# Patient Record
Sex: Female | Born: 1990 | Race: White | Hispanic: No | Marital: Married | Smoking: Former smoker
Health system: Southern US, Community
[De-identification: ages and names within clinical notes are randomized; demographics above are authoritative.]

## PROBLEM LIST (undated history)

## (undated) DIAGNOSIS — F329 Major depressive disorder, single episode, unspecified: Secondary | ICD-10-CM

## (undated) DIAGNOSIS — D509 Iron deficiency anemia, unspecified: Secondary | ICD-10-CM

## (undated) DIAGNOSIS — F32A Depression, unspecified: Secondary | ICD-10-CM

## (undated) DIAGNOSIS — G8929 Other chronic pain: Secondary | ICD-10-CM

## (undated) DIAGNOSIS — R519 Headache, unspecified: Secondary | ICD-10-CM

## (undated) DIAGNOSIS — F419 Anxiety disorder, unspecified: Secondary | ICD-10-CM

## (undated) HISTORY — DX: Depression, unspecified: F32.A

## (undated) HISTORY — DX: Anxiety disorder, unspecified: F41.9

## (undated) HISTORY — PX: TONSILLECTOMY: SUR1361

## (undated) HISTORY — DX: Iron deficiency anemia, unspecified: D50.9

## (undated) HISTORY — DX: Headache, unspecified: R51.9

## (undated) HISTORY — DX: Other chronic pain: G89.29

---
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
## 2013-05-16 ENCOUNTER — Encounter (HOSPITAL_COMMUNITY): Payer: Self-pay | Admitting: Emergency Medicine

## 2013-05-16 ENCOUNTER — Emergency Department (HOSPITAL_COMMUNITY)
Admission: EM | Admit: 2013-05-16 | Discharge: 2013-05-16 | Disposition: A | Discharge status: Home / Self Care | Payer: Self-pay | Attending: Emergency Medicine | Admitting: Emergency Medicine

## 2013-05-16 DIAGNOSIS — S61412A Laceration without foreign body of left hand, initial encounter: Secondary | ICD-10-CM

## 2013-05-16 DIAGNOSIS — S61409A Unspecified open wound of unspecified hand, initial encounter: Secondary | ICD-10-CM | POA: Insufficient documentation

## 2013-05-16 DIAGNOSIS — Y929 Unspecified place or not applicable: Secondary | ICD-10-CM | POA: Insufficient documentation

## 2013-05-16 DIAGNOSIS — Y93G9 Activity, other involving cooking and grilling: Secondary | ICD-10-CM | POA: Insufficient documentation

## 2013-05-16 DIAGNOSIS — W269XXA Contact with unspecified sharp object(s), initial encounter: Secondary | ICD-10-CM | POA: Insufficient documentation

## 2013-05-16 MED ORDER — HYDROCODONE-ACETAMINOPHEN 5-325 MG PO TABS
1.0000 | ORAL_TABLET | Freq: Once | ORAL | Status: AC
  Administered 2013-05-16: 1 via ORAL
  Filled 2013-05-16: qty 1

## 2013-05-16 MED ORDER — LIDOCAINE-EPINEPHRINE 2 %-1:100000 IJ SOLN
20.0000 mL | Freq: Once | INTRAMUSCULAR | Status: DC
  Filled 2013-05-16: qty 20

## 2013-05-16 MED ORDER — DIPHENHYDRAMINE HCL 25 MG PO CAPS
25.0000 mg | ORAL_CAPSULE | Freq: Once | ORAL | Status: AC
  Administered 2013-05-16: 25 mg via ORAL
  Filled 2013-05-16: qty 1

## 2013-05-16 NOTE — Discharge Instructions (Signed)
Please have your sutures remove in 7 days.  Return sooner if you notice signs of infection  Laceration Care, Adult A laceration is a cut or lesion that goes through all layers of the skin and into the tissue just beneath the skin. TREATMENT  Some lacerations may not require closure. Some lacerations may not be able to be closed due to an increased risk of infection. It is important to see your caregiver as soon as possible after an injury to minimize the risk of infection and maximize the opportunity for successful closure. If closure is appropriate, pain medicines may be given, if needed. The wound will be cleaned to help prevent infection. Your caregiver will use stitches (sutures), staples, wound glue (adhesive), or skin adhesive strips to repair the laceration. These tools bring the skin edges together to allow for faster healing and a better cosmetic outcome. However, all wounds will heal with a scar. Once the wound has healed, scarring can be minimized by covering the wound with sunscreen during the day for 1 full year. HOME CARE INSTRUCTIONS  For sutures or staples:  Keep the wound clean and dry.  If you were given a bandage (dressing), you should change it at least once a day. Also, change the dressing if it becomes wet or dirty, or as directed by your caregiver.  Wash the wound with soap and water 2 times a day. Rinse the wound off with water to remove all soap. Pat the wound dry with a clean towel.  After cleaning, apply a thin layer of the antibiotic ointment as recommended by your caregiver. This will help prevent infection and keep the dressing from sticking.  You may shower as usual after the first 24 hours. Do not soak the wound in water until the sutures are removed.  Only take over-the-counter or prescription medicines for pain, discomfort, or fever as directed by your caregiver.  Get your sutures or staples removed as directed by your caregiver. For skin adhesive  strips:  Keep the wound clean and dry.  Do not get the skin adhesive strips wet. You may bathe carefully, using caution to keep the wound dry.  If the wound gets wet, pat it dry with a clean towel.  Skin adhesive strips will fall off on their own. You may trim the strips as the wound heals. Do not remove skin adhesive strips that are still stuck to the wound. They will fall off in time. For wound adhesive:  You may briefly wet your wound in the shower or bath. Do not soak or scrub the wound. Do not swim. Avoid periods of heavy perspiration until the skin adhesive has fallen off on its own. After showering or bathing, gently pat the wound dry with a clean towel.  Do not apply liquid medicine, cream medicine, or ointment medicine to your wound while the skin adhesive is in place. This may loosen the film before your wound is healed.  If a dressing is placed over the wound, be careful not to apply tape directly over the skin adhesive. This may cause the adhesive to be pulled off before the wound is healed.  Avoid prolonged exposure to sunlight or tanning lamps while the skin adhesive is in place. Exposure to ultraviolet light in the first year will darken the scar.  The skin adhesive will usually remain in place for 5 to 10 days, then naturally fall off the skin. Do not pick at the adhesive film. You may need a tetanus shot if:  You cannot remember when you had your last tetanus shot.  You have never had a tetanus shot. If you get a tetanus shot, your arm may swell, get red, and feel warm to the touch. This is common and not a problem. If you need a tetanus shot and you choose not to have one, there is a rare chance of getting tetanus. Sickness from tetanus can be serious. SEEK MEDICAL CARE IF:   You have redness, swelling, or increasing pain in the wound.  You see a red line that goes away from the wound.  You have yellowish-white fluid (pus) coming from the wound.  You have a  fever.  You notice a bad smell coming from the wound or dressing.  Your wound breaks open before or after sutures have been removed.  You notice something coming out of the wound such as wood or glass.  Your wound is on your hand or foot and you cannot move a finger or toe. SEEK IMMEDIATE MEDICAL CARE IF:   Your pain is not controlled with prescribed medicine.  You have severe swelling around the wound causing pain and numbness or a change in color in your arm, hand, leg, or foot.  Your wound splits open and starts bleeding.  You have worsening numbness, weakness, or loss of function of any joint around or beyond the wound.  You develop painful lumps near the wound or on the skin anywhere on your body. MAKE SURE YOU:   Understand these instructions.  Will watch your condition.  Will get help right away if you are not doing well or get worse. Document Released: 02/18/2005 Document Revised: 05/13/2011 Document Reviewed: 08/14/2010 [facility] Patient Information 2014 [place], [place].

## 2013-05-16 NOTE — ED Provider Notes (Signed)
CSN: [id number]     Arrival date & time 05/16/13  2142 History  This chart was scribed for non-physician practitioner, Fayrene Helper, PA-C working with Bonnita Levan. Bernette Mayers, MD by Greggory Stallion, ED scribe. This patient was seen in room TR06C/TR06C and the patient's care was started at 10:08 PM.   Chief Complaint  Patient presents with  . Laceration   The history is provided by the patient. No language interpreter was used.   HPI Comments: Selena Malone is a 23 y.o. female who presents to the Emergency Department complaining of a laceration to her left palm that occurred about 45 minutes ago. Pt was trying to move a food processor and states it sliced her hand. She has sudden onset throbbing left hand pain. Pt states her last tetanus was less than one year ago. No numbness, no other injury.  No specific treatment tried.    History reviewed. No pertinent past medical history. History reviewed. No pertinent past surgical history. No family history on file. History  Substance Use Topics  . Smoking status: Never Smoker   . Smokeless tobacco: Not on file  . Alcohol Use: No   OB History   Grav Para Term Preterm Abortions TAB SAB Ect Mult Living                 Review of Systems  Musculoskeletal: Positive for arthralgias.  Skin: Positive for wound.  All other systems reviewed and are negative.   Allergies  Codeine  Home Medications  No current outpatient prescriptions on file.  BP 119/70  Pulse 96  Temp(Src) 98.8 F (37.1 C) (Oral)  Resp 14  Ht 5\' 3"  (1.6 m)  Wt 186 lb (84.369 kg)  BMI 32.96 kg/m2  SpO2 100%  Physical Exam  Nursing note and vitals reviewed. Constitutional: She is oriented to person, place, and time. She appears well-developed and well-nourished. No distress.  HENT:  Head: Normocephalic and atraumatic.  Eyes: EOM are normal.  Neck: Neck supple. No tracheal deviation present.  Cardiovascular: Normal rate.   Pulmonary/Chest: Effort normal. No respiratory  distress.  Musculoskeletal: Normal range of motion.  Neurological: She is alert and oriented to person, place, and time.  Skin: Skin is warm and dry.  Left hand has a 2.5 cm superficial laceration noted to the hypothenar aspects of the palm. Actively bleeding. No foreign object noted.   Psychiatric: She has a normal mood and affect. Her behavior is normal.    ED Course  Procedures (including critical care time)  DIAGNOSTIC STUDIES: Oxygen Saturation is 100% on RA, normal by my interpretation.    COORDINATION OF CARE: 10:10 PM-Discussed treatment plan which includes laceration repair and pain medication with pt at bedside and pt agreed to plan.   LACERATION REPAIR Performed by: Fayrene Helper Authorized byFayrene Helper Consent: Verbal consent obtained. Risks and benefits: risks, benefits and alternatives were discussed Consent given by: patient Patient identity confirmed: provided demographic data Prepped and Draped in normal sterile fashion Wound explored  Laceration Location: L hand, palmar (hypothenar)  Laceration Length: 2.5cm  No Foreign Bodies seen or palpated  Anesthesia: local infiltration  Local anesthetic: lidocaine 2% w epinephrine  Anesthetic total: 5 ml  Irrigation method: syringe Amount of cleaning: standard  Skin closure: prolene 5.0  Number of sutures: 6  Technique: simple interrupted  Patient tolerance: Patient tolerated the procedure well with no immediate complications.    Labs Review Labs Reviewed - No data to display Imaging Review No results found.   EKG  Interpretation None      MDM   Final diagnoses:  Laceration of left hand without complication, excluding fingers    BP 119/70  Pulse 96  Temp(Src) 98.8 F (37.1 C) (Oral)  Resp 14  Ht 5\' 3"  (1.6 m)  Wt 186 lb (84.369 kg)  BMI 32.96 kg/m2  SpO2 100% I personally performed the services described in this documentation, which was scribed in my presence. The recorded information  has been reviewed and is accurate.    Fayrene HelperBowie Zaylyn Bergdoll, PA-C 05/16/13 [phone]

## 2013-05-16 NOTE — ED Notes (Signed)
Pt. presents with laceration approx . 1 inch at left medial palm sustained this evening while preparing food . Dressing applied at triage .

## 2013-05-16 NOTE — ED Provider Notes (Signed)
Medical screening examination/treatment/procedure(s) were performed by non-physician practitioner and as supervising physician I was immediately available for consultation/collaboration.   EKG Interpretation None        Anyae Griffith B. Bernette MayersSheldon, MD 05/16/13 [phone]

## 2013-08-19 ENCOUNTER — Encounter (HOSPITAL_COMMUNITY): Payer: Self-pay | Admitting: Emergency Medicine

## 2013-08-19 ENCOUNTER — Emergency Department (HOSPITAL_COMMUNITY)
Admission: EM | Admit: 2013-08-19 | Discharge: 2013-08-19 | Disposition: A | Discharge status: Home / Self Care | Payer: Self-pay | Attending: Emergency Medicine | Admitting: Emergency Medicine

## 2013-08-19 DIAGNOSIS — J029 Acute pharyngitis, unspecified: Secondary | ICD-10-CM | POA: Insufficient documentation

## 2013-08-19 DIAGNOSIS — F17200 Nicotine dependence, unspecified, uncomplicated: Secondary | ICD-10-CM | POA: Insufficient documentation

## 2013-08-19 LAB — RAPID STREP SCREEN (MED CTR MEBANE ONLY): Streptococcus, Group A Screen (Direct): NEGATIVE

## 2013-08-19 MED ORDER — IBUPROFEN 800 MG PO TABS
800.0000 mg | ORAL_TABLET | Freq: Once | ORAL | Status: AC
  Administered 2013-08-19: 800 mg via ORAL
  Filled 2013-08-19: qty 1

## 2013-08-19 MED ORDER — IBUPROFEN 600 MG PO TABS: 600.0000 mg | ORAL_TABLET | Freq: Four times a day (QID) | ORAL | Status: DC | PRN

## 2013-08-19 NOTE — Discharge Instructions (Signed)
Pharyngitis °Pharyngitis is redness, pain, and swelling (inflammation) of your pharynx.  °CAUSES  °Pharyngitis is usually caused by infection. Most of the time, these infections are from viruses (viral) and are part of a cold. However, sometimes pharyngitis is caused by bacteria (bacterial). Pharyngitis can also be caused by allergies. Viral pharyngitis may be spread from person to person by coughing, sneezing, and personal items or utensils (cups, forks, spoons, toothbrushes). Bacterial pharyngitis may be spread from person to person by more intimate contact, such as kissing.  °SIGNS AND SYMPTOMS  °Symptoms of pharyngitis include:   °· Sore throat.   °· Tiredness (fatigue).   °· Low-grade fever.   °· Headache. °· Joint pain and muscle aches. °· Skin rashes. °· Swollen lymph nodes. °· Plaque-like film on throat or tonsils (often seen with bacterial pharyngitis). °DIAGNOSIS  °Your health care provider will ask you questions about your illness and your symptoms. Your medical history, along with a physical exam, is often all that is needed to diagnose pharyngitis. Sometimes, a rapid strep test is done. Other lab tests may also be done, depending on the suspected cause.  °TREATMENT  °Viral pharyngitis will usually get better in 3-4 days without the use of medicine. Bacterial pharyngitis is treated with medicines that kill germs (antibiotics).  °HOME CARE INSTRUCTIONS  °· Drink enough water and fluids to keep your urine clear or pale yellow.   °· Only take over-the-counter or prescription medicines as directed by your health care provider:   °¨ If you are prescribed antibiotics, make sure you finish them even if you start to feel better.   °¨ Do not take aspirin.   °· Get lots of rest.   °· Gargle with 8 oz of salt water (½ tsp of salt per 1 qt of water) as often as every 1-2 hours to soothe your throat.   °· Throat lozenges (if you are not at risk for choking) or sprays may be used to soothe your throat. °SEEK MEDICAL  CARE IF:  °· You have large, tender lumps in your neck. °· You have a rash. °· You cough up green, yellow-brown, or bloody spit. °SEEK IMMEDIATE MEDICAL CARE IF:  °· Your neck becomes stiff. °· You drool or are unable to swallow liquids. °· You vomit or are unable to keep medicines or liquids down. °· You have severe pain that does not go away with the use of recommended medicines. °· You have trouble breathing (not caused by a stuffy nose). °MAKE SURE YOU:  °· Understand these instructions. °· Will watch your condition. °· Will get help right away if you are not doing well or get worse. °Document Released: 02/18/2005 Document Revised: 12/09/2012 Document Reviewed: 10/26/2012 °Exit[employer]® Patient Information ©2015 [employer]. This information is not intended to replace advice given to you by your health care provider. Make sure you discuss any questions you have with your health care provider. ° °Salt Water Gargle °This solution will help make your mouth and throat feel better. °HOME CARE INSTRUCTIONS  °· Mix 1 teaspoon of salt in 8 ounces of warm water. °· Gargle with this solution as much or often as you need or as directed. Swish and gargle gently if you have any sores or wounds in your mouth. °· Do not swallow this mixture. °Document Released: 11/23/2003 Document Revised: 05/13/2011 Document Reviewed: 04/15/2008 °ExitCare® Patient Information ©2015 [employer]. This information is not intended to replace advice given to you by your health care provider. Make sure you discuss any questions you have with your health care provider. ° °

## 2013-08-19 NOTE — ED Provider Notes (Signed)
TIME SEEN: 7:49 AM  CHIEF COMPLAINT: Sore throat  HPI: Patient is a 23 year old female with no significant past medical history who presents emergency department with sore throat that started yesterday, pain with swallowing. She denies any fevers or chills. No productive cough. No vomiting or diarrhea. No rash. No headache, neck stiffness. She does have tenderness over her anterior neck. No sick contacts or recent travel.  ROS: See HPI Constitutional: no fever  Eyes: no drainage  ENT: no runny nose   Cardiovascular:  no chest pain  Resp: no SOB  GI: no vomiting GU: no dysuria Integumentary: no rash  Allergy: no hives  Musculoskeletal: no leg swelling  Neurological: no slurred speech ROS otherwise negative  PAST MEDICAL HISTORY/PAST SURGICAL HISTORY:  No past medical history on file.  MEDICATIONS:  Prior to Admission medications   Not on File    ALLERGIES:  Allergies  Allergen Reactions  . Codeine     SOCIAL HISTORY:  History  Substance Use Topics  . Smoking status: Never Smoker   . Smokeless tobacco: Not on file  . Alcohol Use: No    FAMILY HISTORY: No family history on file.  EXAM: There were no vitals taken for this visit. CONSTITUTIONAL: Alert and oriented and responds appropriately to questions. Well-appearing; well-nourished, nontoxic, well-hydrated, in no distress HEAD: Normocephalic EYES: Conjunctivae clear, PERRL ENT: normal nose; no rhinorrhea; moist mucous membranes; patient is status post tonsillectomy but does have your skin in her posterior oropharynx with what appears to be exudate on the left side, no uvular deviation, no trismus or drooling, no changes in her voice, no difficulty breathing or swallowing secretions, no dental caries or dental pain NECK: Supple, no meningismus, no LAD, patient is mildly tender to palpation the anterior neck, no thyromegaly, no goiter or mass appreciated CARD: RRR; S1 and S2 appreciated; no murmurs, no clicks, no rubs,  no gallops RESP: Normal chest excursion without splinting or tachypnea; breath sounds clear and equal bilaterally; no wheezes, no rhonchi, no rales,  ABD/GI: Normal bowel sounds; non-distended; soft, non-tender, no rebound, no guarding BACK:  The back appears normal and is non-tender to palpation, there is no CVA tenderness EXT: Normal ROM in all joints; non-tender to palpation; no edema; normal capillary refill; no cyanosis    SKIN: Normal color for age and race; warm NEURO: Moves all extremities equally PSYCH: The patient's mood and manner are appropriate. Grooming and personal hygiene are appropriate.  MEDICAL DECISION MAKING: Patient here with sore throat that started yesterday. Suspect a viral pharyngitis. She is requesting that her thyroid checked but have discussed with patient that this would be an outpatient past. I am not concerned for any life-threatening thyroid disorder and therefore discussed with patient that she will need to follow up with her primary care physician for this. I do not feel it is her thyroid that is causing her symptoms. We'll send strep swab and give ibuprofen but anticipate discharge home. I'm also not concerned for any deep space neck infection, peritonsillar abscess, dental abscess.  ED PROGRESS: Patient's strep test is negative. Suspect viral pharyngitis. Will discharge home with supportive care instructions and return precautions. Patient verbalizes understanding and is comfortable with this plan.     Layla MawKristen N Ward, DO 08/19/13 [phone]

## 2013-08-19 NOTE — ED Notes (Signed)
Pt in from home c/o sore throat onset yesterday, pt c/o bil neck pain, difficulty swallowing, pt denies fever, chills, cough, pt requesting thyroid checked

## 2013-08-21 LAB — CULTURE, GROUP A STREP

## 2017-03-04 NOTE — L&D Delivery Note (Signed)
Patient is a 27 y.o. now G3P2 s/p NSVD at 2325w2d, who was admitted for elective IOL d/t postdates pregnancy.  She progressed with augmentation (foley bulb, pitocin, AROM)  to complete and pushed 7 minutes to deliver.  Cord clamping delayed by several minutes then clamped by CNM and cut by FOB.  Placenta intact and spontaneous, bleeding minimal. Mom and baby stable prior to transfer to postpartum. She plans on formula feeding. She requests IUD for birth control.  Delivery Note At 1:17 PM a viable and healthy female was delivered via Vaginal, Spontaneous (Presentation: ROA ).  APGAR: 8, 9; weight pending .   Placenta intact and spontaneous, bleeding minimal. 3VCord:  with the following complications  Anesthesia:  Epidural  Episiotomy: None Lacerations: None Suture Repair: None Est. Blood Loss (mL):  184mL  Mom to postpartum.  Baby to Couplet care / Skin to Skin.  Sharyon CableVeronica C Aman Batley CNM 02/27/2018, 1:54 PM

## 2017-08-05 DIAGNOSIS — Z3490 Encounter for supervision of normal pregnancy, unspecified, unspecified trimester: Secondary | ICD-10-CM | POA: Insufficient documentation

## 2017-08-06 ENCOUNTER — Ambulatory Visit (INDEPENDENT_AMBULATORY_CARE_PROVIDER_SITE_OTHER): Payer: Medicaid Other | Admitting: Certified Nurse Midwife

## 2017-08-06 ENCOUNTER — Other Ambulatory Visit (HOSPITAL_COMMUNITY)
Admission: RE | Admit: 2017-08-06 | Discharge: 2017-08-06 | Disposition: A | Discharge status: Home / Self Care | Payer: Medicaid Other | Source: Ambulatory Visit | Attending: Certified Nurse Midwife | Admitting: Certified Nurse Midwife

## 2017-08-06 ENCOUNTER — Encounter: Payer: Self-pay | Admitting: Certified Nurse Midwife

## 2017-08-06 VITALS — BP 122/78 | HR 97 | Wt 187.4 lb

## 2017-08-06 DIAGNOSIS — Z3490 Encounter for supervision of normal pregnancy, unspecified, unspecified trimester: Secondary | ICD-10-CM

## 2017-08-06 DIAGNOSIS — Z3401 Encounter for supervision of normal first pregnancy, first trimester: Secondary | ICD-10-CM

## 2017-08-06 MED ORDER — OB COMPLETE PETITE 35-5-1-200 MG PO CAPS: 1.0000 | ORAL_CAPSULE | Freq: Every day | ORAL | 12 refills | Status: DC

## 2017-08-06 NOTE — Progress Notes (Signed)
Subjective:   Selena Malone is a 27 y.o. G3P1011 at [redacted]w[redacted]d by LMP being seen today for her first obstetrical visit.  Her obstetrical history is significant for obesity. Patient does intend to breast feed. Pregnancy history fully reviewed.  Patient reports no complaints.  HISTORY: OB History  Gravida Para Term Preterm AB Living  3 1 1  0 1 1  SAB TAB Ectopic Multiple Live Births  0 1 0 0 1    # Outcome Date GA Lbr Len/2nd Weight Sex Delivery Anes PTL Lv  3 Current           2 Term 07/20/08 [redacted]w[redacted]d  7 lb 13 oz (3.544 kg) M Vag-Spont   LIV  1 TAB 07/2006            Last pap smear was done unknown.   Past Medical History:  Diagnosis Date  . Iron deficiency anemia    History reviewed. No pertinent surgical history. Family History  Problem Relation Age of Onset  . Drug abuse Mother    Social History   Tobacco Use  . Smoking status: Former Games developer  . Smokeless tobacco: Never Used  Substance Use Topics  . Alcohol use: Not Currently  . Drug use: Not Currently   Allergies  Allergen Reactions  . Codeine Itching   Current Outpatient Medications on File Prior to Visit  Medication Sig Dispense Refill  . docusate sodium (COLACE) 100 MG capsule Take 100 mg by mouth 2 (two) times daily.    Marland Kitchen loratadine-pseudoephedrine (CLARITIN-D 24-HOUR) 10-240 MG 24 hr tablet Take 1 tablet by mouth daily.    . Multiple Vitamin (MULTIVITAMIN WITH MINERALS) TABS tablet Take 1 tablet by mouth daily.    . Prenatal Vit-Fe Fumarate-FA (PRENATAL MULTIVITAMIN) TABS tablet Take 1 tablet by mouth daily at 12 noon.     No current facility-administered medications on file prior to visit.     Review of Systems Pertinent items noted in HPI and remainder of comprehensive ROS otherwise negative.  Exam   Vitals:   08/06/17 0954  BP: 122/78  Pulse: 97  Weight: 187 lb 6.4 oz (85 kg)   Fetal Heart Rate (bpm): 160; doppler  Uterus:     Pelvic Exam: Perineum: no hemorrhoids, normal perineum   Vulva: normal external genitalia, no lesions   Vagina:  normal mucosa, normal discharge   Cervix: no lesions and normal, pap smear done.    Adnexa: normal adnexa and no mass, fullness, tenderness   Bony Pelvis: average  System: General: well-developed, well-nourished female in no acute distress   Breast:  normal appearance, no masses or tenderness   Skin: normal coloration and turgor, no rashes   Neurologic: oriented, normal, negative, normal mood   Extremities: normal strength, tone, and muscle mass, ROM of all joints is normal   HEENT PERRLA, extraocular movement intact and sclera clear, anicteric   Mouth/Teeth mucous membranes moist, pharynx normal without lesions and dental hygiene good   Neck supple and no masses   Cardiovascular: regular rate and rhythm   Respiratory:  no respiratory distress, normal breath sounds   Abdomen: soft, non-tender; bowel sounds normal; no masses,  no organomegaly     Assessment:   Pregnancy: [id number] Patient Active Problem List   Diagnosis Date Noted  . Supervision of normal pregnancy, antepartum 08/05/2017     Plan:  1. Encounter for supervision of normal pregnancy, antepartum, unspecified gravidity      - Obstetric Panel, Including HIV - Hemoglobinopathy evaluation -  Culture, OB Urine - Cytology - PAP - Genetic Screening - Cervicovaginal ancillary only - Inheritest Core(CF97,SMA,FraX) - Vitamin D (25 hydroxy) - Hemoglobin A1c - Prenat-FeCbn-FeAspGl-FA-Omega (OB COMPLETE PETITE) 35-5-1-200 MG CAPS; Take 1 tablet by mouth daily.  Dispense: 30 capsule; Refill: 12   Initial labs drawn. Continue prenatal vitamins. Genetic Screening discussed, NIPS: ordered. Ultrasound discussed; fetal anatomic survey: ordered. Problem list reviewed and updated. The [facility] with multiple MDs and other Advanced Practice Providers was explained to patient; also emphasized that residents, students are part of our  team. Routine obstetric precautions reviewed. Return in about 1 month (around 09/03/2017) for ROB.     Orvilla Cornwallachelle Cianna Kasparian, CNM [facility], [facility]

## 2017-08-07 LAB — CERVICOVAGINAL ANCILLARY ONLY
Chlamydia: NEGATIVE
Neisseria Gonorrhea: NEGATIVE

## 2017-08-08 LAB — HEMOGLOBINOPATHY EVALUATION
HEMOGLOBIN A2 QUANTITATION: 1.6 % — AB (ref 1.8–3.2)
HGB C: 0 %
HGB S: 0 %
HGB VARIANT: 0 %
Hemoglobin F Quantitation: 0 % (ref 0.0–2.0)
Hgb A: 98.4 % (ref 96.4–98.8)

## 2017-08-08 LAB — OBSTETRIC PANEL, INCLUDING HIV
ANTIBODY SCREEN: NEGATIVE
BASOS: 0 %
Basophils Absolute: 0 10*3/uL (ref 0.0–0.2)
EOS (ABSOLUTE): 0 10*3/uL (ref 0.0–0.4)
EOS: 0 %
HEMATOCRIT: 37.4 % (ref 34.0–46.6)
HEMOGLOBIN: 12.4 g/dL (ref 11.1–15.9)
HIV SCREEN 4TH GENERATION: NONREACTIVE
Hepatitis B Surface Ag: NEGATIVE
Immature Grans (Abs): 0 10*3/uL (ref 0.0–0.1)
Immature Granulocytes: 0 %
LYMPHS: 27 %
Lymphocytes Absolute: 1.3 10*3/uL (ref 0.7–3.1)
MCH: 32.6 pg (ref 26.6–33.0)
MCHC: 33.2 g/dL (ref 31.5–35.7)
MCV: 98 fL — AB (ref 79–97)
MONOS ABS: 0.3 10*3/uL (ref 0.1–0.9)
Monocytes: 5 %
NEUTROS ABS: 3.3 10*3/uL (ref 1.4–7.0)
Neutrophils: 68 %
Platelets: 183 10*3/uL (ref 150–450)
RBC: 3.8 x10E6/uL (ref 3.77–5.28)
RDW: 13.2 % (ref 12.3–15.4)
RH TYPE: POSITIVE
RPR Ser Ql: NONREACTIVE
Rubella Antibodies, IGG: 1.75 index (ref >0.99)
WBC: 4.9 10*3/uL (ref 3.4–10.8)

## 2017-08-08 LAB — HEMOGLOBIN A1C
ESTIMATED AVERAGE GLUCOSE: 103 mg/dL
HEMOGLOBIN A1C: 5.2 % (ref 4.8–5.6)

## 2017-08-08 LAB — VITAMIN D 25 HYDROXY (VIT D DEFICIENCY, FRACTURES): VIT D 25 HYDROXY: 40.5 ng/mL (ref 30.0–100.0)

## 2017-08-09 LAB — CULTURE, OB URINE

## 2017-08-09 LAB — URINE CULTURE, OB REFLEX

## 2017-08-11 ENCOUNTER — Other Ambulatory Visit: Payer: Self-pay | Admitting: Certified Nurse Midwife

## 2017-08-11 DIAGNOSIS — D649 Anemia, unspecified: Secondary | ICD-10-CM | POA: Insufficient documentation

## 2017-08-11 DIAGNOSIS — Z3480 Encounter for supervision of other normal pregnancy, unspecified trimester: Secondary | ICD-10-CM

## 2017-08-11 HISTORY — DX: Anemia, unspecified: D64.9

## 2017-08-11 LAB — CYTOLOGY - PAP
Adequacy: ABSENT
DIAGNOSIS: NEGATIVE

## 2017-08-12 ENCOUNTER — Encounter (HOSPITAL_COMMUNITY): Payer: Self-pay | Admitting: Emergency Medicine

## 2017-08-14 ENCOUNTER — Other Ambulatory Visit: Payer: Self-pay | Admitting: Certified Nurse Midwife

## 2017-08-14 DIAGNOSIS — Z3480 Encounter for supervision of other normal pregnancy, unspecified trimester: Secondary | ICD-10-CM

## 2017-08-15 LAB — INHERITEST CORE(CF97,SMA,FRAX)

## 2017-08-19 ENCOUNTER — Other Ambulatory Visit: Payer: Self-pay | Admitting: Certified Nurse Midwife

## 2017-08-19 DIAGNOSIS — Z3480 Encounter for supervision of other normal pregnancy, unspecified trimester: Secondary | ICD-10-CM

## 2017-09-10 ENCOUNTER — Ambulatory Visit (INDEPENDENT_AMBULATORY_CARE_PROVIDER_SITE_OTHER): Payer: Medicaid Other | Admitting: Certified Nurse Midwife

## 2017-09-10 VITALS — BP 110/71 | HR 91 | Wt 190.8 lb

## 2017-09-10 DIAGNOSIS — O26892 Other specified pregnancy related conditions, second trimester: Secondary | ICD-10-CM

## 2017-09-10 DIAGNOSIS — O99012 Anemia complicating pregnancy, second trimester: Secondary | ICD-10-CM

## 2017-09-10 DIAGNOSIS — R519 Other specified pregnancy related conditions, second trimester: Secondary | ICD-10-CM

## 2017-09-10 DIAGNOSIS — Z3480 Encounter for supervision of other normal pregnancy, unspecified trimester: Secondary | ICD-10-CM

## 2017-09-10 DIAGNOSIS — R51 Headache: Secondary | ICD-10-CM

## 2017-09-10 DIAGNOSIS — D649 Anemia, unspecified: Secondary | ICD-10-CM

## 2017-09-10 DIAGNOSIS — Z3482 Encounter for supervision of other normal pregnancy, second trimester: Secondary | ICD-10-CM

## 2017-09-10 MED ORDER — CITRANATAL BLOOM 90-1 MG PO TABS: 1.0000 | ORAL_TABLET | Freq: Every day | ORAL | 12 refills | Status: DC

## 2017-09-10 MED ORDER — BUTALBITAL-APAP-CAFFEINE 50-325-40 MG PO TABS: 1.0000 | ORAL_TABLET | Freq: Four times a day (QID) | ORAL | 4 refills | Status: DC | PRN

## 2017-09-10 NOTE — Progress Notes (Signed)
Patient reports fetal movement with occasional uterine irritability. Pt states that she been having some headaches lately and wants to further discuss with provider. Pt also stated that she had an u/s at an outside source and was told that she appears to be further than 16 weeks.

## 2017-09-10 NOTE — Progress Notes (Signed)
   PRENATAL VISIT NOTE  Subjective:  Selena Malone is a 27 y.o. G3P1011 at 1529w0d being seen today for ongoing prenatal care.  She is currently monitored for the following issues for this low-risk pregnancy and has Supervision of normal pregnancy, antepartum and Low blood hemoglobin A2 on their problem list.  Patient reports headache, no bleeding, no contractions, no cramping and no leaking.  Contractions: Irritability. Vag. Bleeding: None.  Movement: Present. Denies leaking of fluid.   The following portions of the patient's history were reviewed and updated as appropriate: allergies, current medications, past family history, past medical history, past social history, past surgical history and problem list. Problem list updated.  Objective:   Vitals:   09/10/17 0914  BP: 110/71  Pulse: 91  Weight: 190 lb 12.8 oz (86.5 kg)    Fetal Status:     Movement: Present     General:  Alert, oriented and cooperative. Patient is in no acute distress.  Skin: Skin is warm and dry. No rash noted.   Cardiovascular: Normal heart rate noted  Respiratory: Normal respiratory effort, no problems with respiration noted  Abdomen: Soft, gravid, appropriate for gestational age.  Pain/Pressure: Absent     Pelvic: Cervical exam deferred        Extremities: Normal range of motion.  Edema: None  Mental Status: Normal mood and affect. Normal behavior. Normal judgment and thought content.   Assessment and Plan:  Pregnancy: G3P1011 at 229w0d  1. Supervision of other normal pregnancy, antepartum     Doing well.  Has anatomy scan scheduled for 10/01/17.  - AFP, Serum, Open Spina Bifida  2. Headache in pregnancy, antepartum, second trimester     HA precautions discussed - butalbital-acetaminophen-caffeine (FIORICET, ESGIC) 50-325-40 MG tablet; Take 1-2 tablets by mouth every 6 (six) hours as needed.  Dispense: 45 tablet; Refill: 4  3. Anemia during pregnancy in second trimester      -  Prenatal-DSS-FeCb-FeGl-FA (CITRANATAL BLOOM) 90-1 MG TABS; Take 1 tablet by mouth daily.  Dispense: 30 tablet; Refill: 12  Preterm  labor symptoms and general obstetric precautions including but not limited to vaginal bleeding, contractions, leaking of fluid and fetal movement were reviewed in detail with the patient. Please refer to After Visit Summary for other counseling recommendations.  Return in about 1 month (around 10/08/2017) for ROB.  Future Appointments  Date Time Provider Department Center  10/01/2017  9:30 AM [facility] US 1 [facility]    Roe Coombsachelle A Emmer Lillibridge, CNM

## 2017-09-12 LAB — AFP, SERUM, OPEN SPINA BIFIDA
AFP MOM: 1.7
AFP Value: 47.5 ng/mL
Gest. Age on Collection Date: 16 weeks
Maternal Age At EDD: 29.9 yr
OSBR RISK 1 IN: 1630
TEST RESULTS AFP: NEGATIVE
Weight: 190 [lb_av]

## 2017-09-24 ENCOUNTER — Encounter (HOSPITAL_COMMUNITY): Payer: Self-pay

## 2017-10-01 ENCOUNTER — Other Ambulatory Visit (HOSPITAL_COMMUNITY): Payer: Self-pay | Admitting: *Deleted

## 2017-10-01 ENCOUNTER — Ambulatory Visit (HOSPITAL_COMMUNITY)
Admission: RE | Admit: 2017-10-01 | Discharge: 2017-10-01 | Disposition: A | Discharge status: Home / Self Care | Payer: Medicaid Other | Source: Ambulatory Visit | Attending: Certified Nurse Midwife | Admitting: Certified Nurse Midwife

## 2017-10-01 DIAGNOSIS — Z3A19 19 weeks gestation of pregnancy: Secondary | ICD-10-CM | POA: Diagnosis not present

## 2017-10-01 DIAGNOSIS — Z363 Encounter for antenatal screening for malformations: Secondary | ICD-10-CM | POA: Insufficient documentation

## 2017-10-01 DIAGNOSIS — E669 Obesity, unspecified: Secondary | ICD-10-CM | POA: Insufficient documentation

## 2017-10-01 DIAGNOSIS — Z3687 Encounter for antenatal screening for uncertain dates: Secondary | ICD-10-CM | POA: Insufficient documentation

## 2017-10-01 DIAGNOSIS — O4440 Low lying placenta NOS or without hemorrhage, unspecified trimester: Secondary | ICD-10-CM

## 2017-10-01 DIAGNOSIS — O99212 Obesity complicating pregnancy, second trimester: Secondary | ICD-10-CM

## 2017-10-01 DIAGNOSIS — Z3490 Encounter for supervision of normal pregnancy, unspecified, unspecified trimester: Secondary | ICD-10-CM

## 2017-10-08 ENCOUNTER — Ambulatory Visit (INDEPENDENT_AMBULATORY_CARE_PROVIDER_SITE_OTHER): Payer: Medicaid Other | Admitting: Obstetrics and Gynecology

## 2017-10-08 ENCOUNTER — Encounter: Payer: Self-pay | Admitting: Obstetrics and Gynecology

## 2017-10-08 DIAGNOSIS — Z3480 Encounter for supervision of other normal pregnancy, unspecified trimester: Secondary | ICD-10-CM

## 2017-10-08 MED ORDER — FAMOTIDINE 20 MG PO TABS: 20.0000 mg | ORAL_TABLET | Freq: Two times a day (BID) | ORAL | 3 refills | Status: DC

## 2017-10-08 NOTE — Progress Notes (Signed)
   PRENATAL VISIT NOTE  Subjective:  Selena Malone is a 27 y.o. G3P1011 at 6261w0d being seen today for ongoing prenatal care.  She is currently monitored for the following issues for this low-risk pregnancy and has Supervision of normal pregnancy, antepartum and Low blood hemoglobin A2 on their problem list.  Patient reports no complaints.  Contractions: Not present. Vag. Bleeding: None.  Movement: Present. Denies leaking of fluid.   The following portions of the patient's history were reviewed and updated as appropriate: allergies, current medications, past family history, past medical history, past social history, past surgical history and problem list. Problem list updated.  Objective:   Vitals:   10/08/17 0858  BP: 93/65  Pulse: 92  Weight: 195 lb 1.6 oz (88.5 kg)    Fetal Status: Fetal Heart Rate (bpm): 140 Fundal Height: 21 cm Movement: Present     General:  Alert, oriented and cooperative. Patient is in no acute distress.  Skin: Skin is warm and dry. No rash noted.   Cardiovascular: Normal heart rate noted  Respiratory: Normal respiratory effort, no problems with respiration noted  Abdomen: Soft, gravid, appropriate for gestational age.  Pain/Pressure: Absent     Pelvic: Cervical exam deferred        Extremities: Normal range of motion.  Edema: None  Mental Status: Normal mood and affect. Normal behavior. Normal judgment and thought content.   Assessment and Plan:  Pregnancy: G3P1011 at 2461w0d  1. Supervision of other normal pregnancy, antepartum Patient is doing well without complaints Reports improvement in her HA Rx pepcid provided to help with heartburn BTL consent today Pelvic rest precautions reviewed regarding low lying placenta- follow up ultrasound 12/2017  Preterm labor symptoms and general obstetric precautions including but not limited to vaginal bleeding, contractions, leaking of fluid and fetal movement were reviewed in detail with the  patient. Please refer to After Visit Summary for other counseling recommendations.  Return in about 1 month (around 11/05/2017) for ROB.  Future Appointments  Date Time Provider Department Center  12/24/2017  8:15 AM [facility] US 4 [facility]    Catalina AntiguaPeggy Amador Braddy, MD

## 2017-11-05 ENCOUNTER — Ambulatory Visit (INDEPENDENT_AMBULATORY_CARE_PROVIDER_SITE_OTHER): Payer: Medicaid Other | Admitting: Obstetrics & Gynecology

## 2017-11-05 ENCOUNTER — Encounter: Payer: Self-pay | Admitting: Obstetrics & Gynecology

## 2017-11-05 VITALS — BP 100/65 | HR 88 | Wt 202.0 lb

## 2017-11-05 DIAGNOSIS — O4442 Low lying placenta NOS or without hemorrhage, second trimester: Secondary | ICD-10-CM

## 2017-11-05 DIAGNOSIS — O4440 Low lying placenta NOS or without hemorrhage, unspecified trimester: Secondary | ICD-10-CM | POA: Insufficient documentation

## 2017-11-05 DIAGNOSIS — Z3482 Encounter for supervision of other normal pregnancy, second trimester: Secondary | ICD-10-CM

## 2017-11-05 DIAGNOSIS — Z3480 Encounter for supervision of other normal pregnancy, unspecified trimester: Secondary | ICD-10-CM

## 2017-11-05 NOTE — Patient Instructions (Signed)
Second Trimester of Pregnancy The second trimester is from week 13 through week 28, month 4 through 6. This is often the time in pregnancy that you feel your best. Often times, morning sickness has lessened or quit. You may have more energy, and you may get hungry more often. Your unborn baby (fetus) is growing rapidly. At the end of the sixth month, he or she is about 9 inches long and weighs about 1 pounds. You will likely feel the baby move (quickening) between 18 and 20 weeks of pregnancy. Follow these instructions at home:  Avoid all smoking, herbs, and alcohol. Avoid drugs not approved by your doctor.  Do not use any tobacco products, including cigarettes, chewing tobacco, and electronic cigarettes. If you need help quitting, ask your doctor. You may get counseling or other support to help you quit.  Only take medicine as told by your doctor. Some medicines are safe and some are not during pregnancy.  Exercise only as told by your doctor. Stop exercising if you start having cramps.  Eat regular, healthy meals.  Wear a good support bra if your breasts are tender.  Do not use hot tubs, steam rooms, or saunas.  Wear your seat belt when driving.  Avoid raw meat, uncooked cheese, and liter boxes and soil used by cats.  Take your prenatal vitamins.  Take 1500-2000 milligrams of calcium daily starting at the 20th week of pregnancy until you deliver your baby.  Try taking medicine that helps you poop (stool softener) as needed, and if your doctor approves. Eat more fiber by eating fresh fruit, vegetables, and whole grains. Drink enough fluids to keep your pee (urine) clear or pale yellow.  Take warm water baths (sitz baths) to soothe pain or discomfort caused by hemorrhoids. Use hemorrhoid cream if your doctor approves.  If you have puffy, bulging veins (varicose veins), wear support hose. Raise (elevate) your feet for 15 minutes, 3-4 times a day. Limit salt in your diet.  Avoid heavy  lifting, wear low heals, and sit up straight.  Rest with your legs raised if you have leg cramps or low back pain.  Visit your dentist if you have not gone during your pregnancy. Use a soft toothbrush to brush your teeth. Be gentle when you floss.  You can have sex (intercourse) unless your doctor tells you not to.  Go to your doctor visits. Get help if:  You feel dizzy.  You have mild cramps or pressure in your lower belly (abdomen).  You have a nagging pain in your belly area.  You continue to feel sick to your stomach (nauseous), throw up (vomit), or have watery poop (diarrhea).  You have bad smelling fluid coming from your vagina.  You have pain with peeing (urination). Get help right away if:  You have a fever.  You are leaking fluid from your vagina.  You have spotting or bleeding from your vagina.  You have severe belly cramping or pain.  You lose or gain weight rapidly.  You have trouble catching your breath and have chest pain.  You notice sudden or extreme puffiness (swelling) of your face, hands, ankles, feet, or legs.  You have not felt the baby move in over an hour.  You have severe headaches that do not go away with medicine.  You have vision changes. This information is not intended to replace advice given to you by your health care provider. Make sure you discuss any questions you have with your health care   provider. Document Released: 05/15/2009 Document Revised: 07/27/2015 Document Reviewed: 04/21/2012 Elsevier Interactive Patient Education  2017 [employer]  

## 2017-11-05 NOTE — Progress Notes (Signed)
Wants to discuss being induced.

## 2017-11-05 NOTE — Progress Notes (Signed)
   PRENATAL VISIT NOTE  Subjective:  Selena Malone is a 27 y.o. G3P1011 at [redacted]w[redacted]d being seen today for ongoing prenatal care.  She is currently monitored for the following issues for this high-risk pregnancy and has Supervision of normal pregnancy, antepartum; Low blood hemoglobin A2; and Low lying placenta, antepartum on their problem list.  Patient reports no complaints.  Contractions: Irritability. Vag. Bleeding: None.  Movement: Present. Denies leaking of fluid.   The following portions of the patient's history were reviewed and updated as appropriate: allergies, current medications, past family history, past medical history, past social history, past surgical history and problem list. Problem list updated.  Objective:   Vitals:   11/05/17 0831  BP: 100/65  Pulse: 88  Weight: 202 lb (91.6 kg)    Fetal Status: Fetal Heart Rate (bpm): 142   Movement: Present     General:  Alert, oriented and cooperative. Patient is in no acute distress.  Skin: Skin is warm and dry. No rash noted.   Cardiovascular: Normal heart rate noted  Respiratory: Normal respiratory effort, no problems with respiration noted  Abdomen: Soft, gravid, appropriate for gestational age.  Pain/Pressure: Present     Pelvic: Cervical exam deferred        Extremities: Normal range of motion.  Edema: None  Mental Status: Normal mood and affect. Normal behavior. Normal judgment and thought content.   Assessment and Plan:  Pregnancy: G3P1011 at [redacted]w[redacted]d  1. Supervision of other normal pregnancy, antepartum   2. Low lying placenta, antepartum F/u US 32 weeks  Preterm labor symptoms and general obstetric precautions including but not limited to vaginal bleeding, contractions, leaking of fluid and fetal movement were reviewed in detail with the patient. Please refer to After Visit Summary for other counseling recommendations.  Return in about 4 weeks (around 12/03/2017).  Future Appointments  Date Time Provider  Department Center  12/24/2017  8:15 AM [facility] [place] 4 [facility]    Scheryl Darter, MD

## 2017-12-03 ENCOUNTER — Encounter: Payer: Self-pay | Admitting: Obstetrics & Gynecology

## 2017-12-03 ENCOUNTER — Other Ambulatory Visit: Payer: Medicaid Other

## 2017-12-03 ENCOUNTER — Ambulatory Visit (INDEPENDENT_AMBULATORY_CARE_PROVIDER_SITE_OTHER): Payer: Medicaid Other | Admitting: Obstetrics & Gynecology

## 2017-12-03 DIAGNOSIS — Z23 Encounter for immunization: Secondary | ICD-10-CM | POA: Diagnosis not present

## 2017-12-03 DIAGNOSIS — Z3480 Encounter for supervision of other normal pregnancy, unspecified trimester: Secondary | ICD-10-CM

## 2017-12-03 DIAGNOSIS — Z3483 Encounter for supervision of other normal pregnancy, third trimester: Secondary | ICD-10-CM

## 2017-12-03 LAB — POCT URINALYSIS DIPSTICK
Bilirubin, UA: NEGATIVE
Glucose, UA: NEGATIVE
Ketones, UA: NEGATIVE
Nitrite, UA: NEGATIVE
PH UA: 6 (ref 5.0–8.0)
PROTEIN UA: NEGATIVE
RBC UA: NEGATIVE
Spec Grav, UA: 1.015 (ref 1.010–1.025)
UROBILINOGEN UA: 0.2 U/dL

## 2017-12-03 NOTE — Patient Instructions (Signed)

## 2017-12-03 NOTE — Progress Notes (Signed)
Pt c/o dysuria and urinary odor x 1-2 days. Pt also c/o worsening R side sciatic nerve pain.

## 2017-12-03 NOTE — Progress Notes (Signed)
   PRENATAL VISIT NOTE  Subjective:  Selena Malone is a 27 y.o. G3P1011 at [redacted]w[redacted]d being seen today for ongoing prenatal care.  She is currently monitored for the following issues for this low-risk pregnancy and has Supervision of normal pregnancy, antepartum; Low blood hemoglobin A2; and Low lying placenta, antepartum on their problem list.  Patient reports backache.  Contractions: Irregular. Vag. Bleeding: None.  Movement: Present. Denies leaking of fluid.   The following portions of the patient's history were reviewed and updated as appropriate: allergies, current medications, past family history, past medical history, past social history, past surgical history and problem list. Problem list updated.  Objective:   Vitals:   12/03/17 0838  BP: 95/67  Pulse: (!) 101  Weight: 205 lb 6.4 oz (93.2 kg)    Fetal Status: Fetal Heart Rate (bpm): 149 Fundal Height: 28 cm Movement: Present     General:  Alert, oriented and cooperative. Patient is in no acute distress.  Skin: Skin is warm and dry. No rash noted.   Cardiovascular: Normal heart rate noted  Respiratory: Normal respiratory effort, no problems with respiration noted  Abdomen: Soft, gravid, appropriate for gestational age.  Pain/Pressure: Present     Pelvic: Cervical exam deferred        Extremities: Normal range of motion.  Edema: None  Mental Status: Normal mood and affect. Normal behavior. Normal judgment and thought content.   Assessment and Plan:  Pregnancy: G3P1011 at [redacted]w[redacted]d  1. Supervision of other normal pregnancy, antepartum Instructions for back, sciatic pain - Glucose Tolerance, 2 Hours w/1 Hour - CBC - HIV Antibody (routine testing w rflx) - RPR - Tdap vaccine greater than or equal to 7yo IM - Flu Vaccine QUAD 36+ mos IM (Fluarix, Quad PF) - Culture, OB Urine  Preterm labor symptoms and general obstetric precautions including but not limited to vaginal bleeding, contractions, leaking of fluid and fetal  movement were reviewed in detail with the patient. Please refer to After Visit Summary for other counseling recommendations.  Return in about 2 weeks (around 12/17/2017).  Future Appointments  Date Time Provider Department Center  12/24/2017  8:15 AM [facility] [place] 4 [facility]    Scheryl Darter, MD

## 2017-12-04 LAB — GLUCOSE TOLERANCE, 2 HOURS W/ 1HR
GLUCOSE, 1 HOUR: 125 mg/dL (ref 65–179)
GLUCOSE, FASTING: 81 mg/dL (ref 65–91)
Glucose, 2 hour: 107 mg/dL (ref 65–152)

## 2017-12-04 LAB — CBC
Hematocrit: 32.7 % — ABNORMAL LOW (ref 34.0–46.6)
Hemoglobin: 10.8 g/dL — ABNORMAL LOW (ref 11.1–15.9)
MCH: 32.5 pg (ref 26.6–33.0)
MCHC: 33 g/dL (ref 31.5–35.7)
MCV: 99 fL — ABNORMAL HIGH (ref 79–97)
PLATELETS: 159 10*3/uL (ref 150–450)
RBC: 3.32 x10E6/uL — ABNORMAL LOW (ref 3.77–5.28)
RDW: 12.6 % (ref 12.3–15.4)
WBC: 5.6 10*3/uL (ref 3.4–10.8)

## 2017-12-04 LAB — RPR: RPR Ser Ql: NONREACTIVE

## 2017-12-04 LAB — HIV ANTIBODY (ROUTINE TESTING W REFLEX): HIV Screen 4th Generation wRfx: NONREACTIVE

## 2017-12-06 LAB — URINE CULTURE, OB REFLEX

## 2017-12-06 LAB — CULTURE, OB URINE

## 2017-12-17 ENCOUNTER — Other Ambulatory Visit: Payer: Self-pay

## 2017-12-17 ENCOUNTER — Encounter: Payer: Self-pay | Admitting: Certified Nurse Midwife

## 2017-12-17 ENCOUNTER — Ambulatory Visit (INDEPENDENT_AMBULATORY_CARE_PROVIDER_SITE_OTHER): Payer: Medicaid Other | Admitting: Certified Nurse Midwife

## 2017-12-17 VITALS — BP 110/69 | HR 96 | Wt 209.4 lb

## 2017-12-17 DIAGNOSIS — O4440 Low lying placenta NOS or without hemorrhage, unspecified trimester: Secondary | ICD-10-CM

## 2017-12-17 DIAGNOSIS — Z3490 Encounter for supervision of normal pregnancy, unspecified, unspecified trimester: Secondary | ICD-10-CM

## 2017-12-17 DIAGNOSIS — N930 Postcoital and contact bleeding: Secondary | ICD-10-CM

## 2017-12-17 NOTE — Progress Notes (Signed)
ROB.  C/o spotting after sex on Sunday.

## 2017-12-17 NOTE — Progress Notes (Signed)
   PRENATAL VISIT NOTE  Subjective:  Selena Malone is a 27 y.o. G3P1011 at [redacted]w[redacted]d being seen today for ongoing prenatal care.  She is currently monitored for the following issues for this low-risk pregnancy and has Supervision of normal pregnancy, antepartum; Low blood hemoglobin A2; and Low lying placenta, antepartum on their problem list.  Patient reports vaginal spotting after intercourse.  Contractions: Irritability. Vag. Bleeding: None.  Movement: Present. Denies leaking of fluid.   The following portions of the patient's history were reviewed and updated as appropriate: allergies, current medications, past family history, past medical history, past social history, past surgical history and problem list. Problem list updated.  Objective:   Vitals:   12/17/17 0910  BP: 110/69  Pulse: 96  Weight: 209 lb 6.4 oz (95 kg)    Fetal Status: Fetal Heart Rate (bpm): 139 Fundal Height: 32 cm Movement: Present     General:  Alert, oriented and cooperative. Patient is in no acute distress.  Skin: Skin is warm and dry. No rash noted.   Cardiovascular: Normal heart rate noted  Respiratory: Normal respiratory effort, no problems with respiration noted  Abdomen: Soft, gravid, appropriate for gestational age.  Pain/Pressure: Present     Pelvic: Cervical exam deferred        Extremities: Normal range of motion.  Edema: Trace  Mental Status: Normal mood and affect. Normal behavior. Normal judgment and thought content.   Assessment and Plan:  Pregnancy: G3P1011 at [redacted]w[redacted]d  1. Encounter for supervision of normal pregnancy, antepartum, unspecified gravidity - Patient doing well, complains of vaginal spotting after intercourse  - Anticipatory guidance on upcoming appointments  - Follow up US scheduled for 10/23 to assess for placental location and fetal growth   2. Low lying placenta, antepartum - Low lying placenta noted on Anatomy US on 7/31, follow up US scheduled   3. PCB (post coital  bleeding) - Reports intercourse Saturday night, immediately having dark red vaginal spotting after IC. Woke up Sunday morning to continued spotting that was lighter in amount and color, describes spotting as pinkish red when she wiped. Vaginal bleeding stopped on Sunday per patient.  - Denies current vaginal bleeding, abdominal pain or cramping  - She denies having to wear a pad or liner for vaginal bleeding  - Follow up US scheduled for next week to assess for placental location, encouraged pelvic rest until [place]- patient verbalizes understanding.    Preterm labor symptoms and general obstetric precautions including but not limited to vaginal bleeding, contractions, leaking of fluid and fetal movement were reviewed in detail with the patient. Please refer to After Visit Summary for other counseling recommendations.  Return in about 2 weeks (around 12/31/2017) for ROB.  Future Appointments  Date Time Provider Department Center  12/24/2017  8:15 AM [facility] [place] 4 [facility]  12/31/2017  9:00 AM Sharyon Cable, CNM [facility] None  01/14/2018  9:00 AM Sharyon Cable, CNM [facility] None    Sharyon Cable, CNM

## 2017-12-24 ENCOUNTER — Other Ambulatory Visit (HOSPITAL_COMMUNITY): Payer: Self-pay | Admitting: Obstetrics and Gynecology

## 2017-12-24 ENCOUNTER — Ambulatory Visit (HOSPITAL_COMMUNITY)
Admission: RE | Admit: 2017-12-24 | Discharge: 2017-12-24 | Disposition: A | Discharge status: Home / Self Care | Payer: Medicaid Other | Source: Ambulatory Visit | Attending: Obstetrics & Gynecology | Admitting: Obstetrics & Gynecology

## 2017-12-24 DIAGNOSIS — Z363 Encounter for antenatal screening for malformations: Secondary | ICD-10-CM | POA: Diagnosis present

## 2017-12-24 DIAGNOSIS — O99213 Obesity complicating pregnancy, third trimester: Secondary | ICD-10-CM | POA: Diagnosis not present

## 2017-12-24 DIAGNOSIS — O4443 Low lying placenta NOS or without hemorrhage, third trimester: Secondary | ICD-10-CM

## 2017-12-24 DIAGNOSIS — O4440 Low lying placenta NOS or without hemorrhage, unspecified trimester: Secondary | ICD-10-CM

## 2017-12-24 DIAGNOSIS — Z3A31 31 weeks gestation of pregnancy: Secondary | ICD-10-CM | POA: Diagnosis not present

## 2017-12-24 DIAGNOSIS — E669 Obesity, unspecified: Secondary | ICD-10-CM | POA: Insufficient documentation

## 2017-12-24 DIAGNOSIS — O9989 Other specified diseases and conditions complicating pregnancy, childbirth and the puerperium: Secondary | ICD-10-CM | POA: Diagnosis not present

## 2017-12-31 ENCOUNTER — Inpatient Hospital Stay (HOSPITAL_COMMUNITY)
Admission: AD | Admit: 2017-12-31 | Discharge: 2017-12-31 | Disposition: A | Discharge status: Home / Self Care | Payer: Medicaid Other | Source: Ambulatory Visit | Attending: Obstetrics & Gynecology | Admitting: Obstetrics & Gynecology

## 2017-12-31 ENCOUNTER — Encounter (HOSPITAL_COMMUNITY): Payer: Self-pay

## 2017-12-31 ENCOUNTER — Encounter: Payer: Medicaid Other | Admitting: Certified Nurse Midwife

## 2017-12-31 DIAGNOSIS — R42 Dizziness and giddiness: Secondary | ICD-10-CM | POA: Diagnosis present

## 2017-12-31 DIAGNOSIS — O26893 Other specified pregnancy related conditions, third trimester: Secondary | ICD-10-CM | POA: Insufficient documentation

## 2017-12-31 DIAGNOSIS — Z3A32 32 weeks gestation of pregnancy: Secondary | ICD-10-CM | POA: Insufficient documentation

## 2017-12-31 DIAGNOSIS — H8190 Unspecified disorder of vestibular function, unspecified ear: Secondary | ICD-10-CM

## 2017-12-31 DIAGNOSIS — O9989 Other specified diseases and conditions complicating pregnancy, childbirth and the puerperium: Secondary | ICD-10-CM | POA: Diagnosis not present

## 2017-12-31 LAB — URINALYSIS, ROUTINE W REFLEX MICROSCOPIC
Bilirubin Urine: NEGATIVE
Glucose, UA: NEGATIVE mg/dL
Hgb urine dipstick: NEGATIVE
KETONES UR: NEGATIVE mg/dL
Leukocytes, UA: NEGATIVE
NITRITE: NEGATIVE
PROTEIN: NEGATIVE mg/dL
Specific Gravity, Urine: 1.016 (ref 1.005–1.030)
pH: 6 (ref 5.0–8.0)

## 2017-12-31 LAB — CBC
HCT: 33 % — ABNORMAL LOW (ref 36.0–46.0)
Hemoglobin: 10.7 g/dL — ABNORMAL LOW (ref 12.0–15.0)
MCH: 32.4 pg (ref 26.0–34.0)
MCHC: 32.4 g/dL (ref 30.0–36.0)
MCV: 100 fL (ref 80.0–100.0)
Platelets: 156 10*3/uL (ref 150–400)
RBC: 3.3 MIL/uL — ABNORMAL LOW (ref 3.87–5.11)
RDW: 13.7 % (ref 11.5–15.5)
WBC: 6.2 10*3/uL (ref 4.0–10.5)
nRBC: 0 % (ref 0.0–0.2)

## 2017-12-31 LAB — BASIC METABOLIC PANEL
ANION GAP: 8 (ref 5–15)
BUN: 9 mg/dL (ref 6–20)
CALCIUM: 8.2 mg/dL — AB (ref 8.9–10.3)
CO2: 22 mmol/L (ref 22–32)
Chloride: 107 mmol/L (ref 98–111)
Creatinine, Ser: 0.65 mg/dL (ref 0.44–1.00)
Glucose, Bld: 99 mg/dL (ref 70–99)
Potassium: 4.2 mmol/L (ref 3.5–5.1)
SODIUM: 137 mmol/L (ref 135–145)

## 2017-12-31 MED ORDER — LACTATED RINGERS IV BOLUS
1000.0000 mL | Freq: Once | INTRAVENOUS | Status: AC
  Administered 2017-12-31: 1000 mL via INTRAVENOUS

## 2017-12-31 MED ORDER — MECLIZINE HCL 25 MG PO TABS: 25.0000 mg | ORAL_TABLET | Freq: Three times a day (TID) | ORAL | 0 refills | Status: DC | PRN

## 2017-12-31 MED ORDER — MECLIZINE HCL 25 MG PO TABS
50.0000 mg | ORAL_TABLET | Freq: Once | ORAL | Status: AC
  Administered 2017-12-31: 50 mg via ORAL
  Filled 2017-12-31: qty 2

## 2017-12-31 MED ORDER — ONDANSETRON 4 MG PO TBDP: 4.0000 mg | ORAL_TABLET | Freq: Three times a day (TID) | ORAL | 0 refills | Status: DC | PRN

## 2017-12-31 MED ORDER — ONDANSETRON HCL 4 MG/2ML IJ SOLN
4.0000 mg | Freq: Once | INTRAMUSCULAR | Status: AC
  Administered 2017-12-31: 4 mg via INTRAVENOUS
  Filled 2017-12-31: qty 2

## 2017-12-31 NOTE — MAU Note (Signed)
Pt reports "severe dizziness" since 6 am, vomited x one, nauseated.

## 2017-12-31 NOTE — MAU Provider Note (Signed)
Chief Complaint:  Dizziness; Nausea; and Emesis   First Provider Initiated Contact with Patient 12/31/17 [phone]     HPI: Selena Malone is a 27 y.o. G3P1011 at [redacted]w[redacted]d who presents to maternity admissions reporting dizziness. Symptoms started this morning. Reports some intermittent dizziness during the pregnancy but this is different. Feels like she is spinning. Needs assistance from her spouse to walk. Endorses nausea & vomiting as well. Has vomited once this morning. Position changes make symptoms worse. No fever/chills, sinus pressure, cp, palpitations, SOB, or ear pain. Denies headache, LOC, or recent trauma.   Denies contractions, leakage of fluid or vaginal bleeding. Good fetal movement.   Past Medical History:  Diagnosis Date  . Iron deficiency anemia    OB History  Gravida Para Term Preterm AB Living  3 1 1  0 1 1  SAB TAB Ectopic Multiple Live Births  0 1 0   1    # Outcome Date GA Lbr Len/2nd Weight Sex Delivery Anes PTL Lv  3 Current           2 Term 07/20/08 [redacted]w[redacted]d  3544 g M Vag-Spont   LIV  1 TAB 07/2006           History reviewed. No pertinent surgical history. Family History  Problem Relation Age of Onset  . Drug abuse Mother    Social History   Tobacco Use  . Smoking status: Former Games developer  . Smokeless tobacco: Never Used  Substance Use Topics  . Alcohol use: Not Currently  . Drug use: Not Currently   Allergies  Allergen Reactions  . Codeine Itching   No medications prior to admission.    I have reviewed patient's Past Medical Hx, Surgical Hx, Family Hx, Social Hx, medications and allergies.   ROS:  Review of Systems  HENT: Negative for congestion, ear pain and sinus pressure.   Respiratory: Negative.   Cardiovascular: Negative.   Gastrointestinal: Positive for nausea and vomiting. Negative for abdominal pain.  Neurological: Positive for dizziness. Negative for syncope and headaches.    Physical Exam   Patient Vitals for the past 24 hrs:  BP  Temp Temp src Pulse Resp SpO2 Height Weight  12/31/17 1022 (!) 113/54 - - 89 18 - - -  12/31/17 0849 104/67 - - - - - - -  12/31/17 0800 112/66 98 F (36.7 C) Oral (!) 101 16 100 % 5\' 4"  (1.626 m) 96.2 kg   Orthostatic VS for the past 24 hrs:  BP- Lying Pulse- Lying BP- Sitting Pulse- Sitting BP- Standing at 0 minutes Pulse- Standing at 0 minutes  12/31/17 0849 104/67 94 109/56 94 113/76 89     Constitutional: Well-developed, well-nourished female in no acute distress.  Cardiovascular: normal rate & rhythm, no murmur HEENT: TM normal bilaterally. No cerumen impaction Respiratory: normal effort, lung sounds clear throughout GI: Abd soft, non-tender, gravid appropriate for gestational age. Pos BS x 4 MS: Extremities nontender, no edema, normal ROM Neurologic: Alert and oriented x 4. No nystagmus  NST:  Baseline: 135 bpm, Variability: Good {> 6 bpm), Accelerations: Reactive and Decelerations: Absent   Labs: Results for orders placed or performed during the hospital encounter of 12/31/17 (from the past 24 hour(s))  Urinalysis, Routine w reflex microscopic     Status: Abnormal   Collection Time: 12/31/17  8:18 AM  Result Value Ref Range   Color, Urine YELLOW YELLOW   APPearance HAZY (A) CLEAR   Specific Gravity, Urine 1.016 1.005 - 1.030  pH 6.0 5.0 - 8.0   Glucose, UA NEGATIVE NEGATIVE mg/dL   Hgb urine dipstick NEGATIVE NEGATIVE   Bilirubin Urine NEGATIVE NEGATIVE   Ketones, ur NEGATIVE NEGATIVE mg/dL   Protein, ur NEGATIVE NEGATIVE mg/dL   Nitrite NEGATIVE NEGATIVE   Leukocytes, UA NEGATIVE NEGATIVE  CBC     Status: Abnormal   Collection Time: 12/31/17  9:11 AM  Result Value Ref Range   WBC 6.2 4.0 - 10.5 K/uL   RBC 3.30 (L) 3.87 - 5.11 MIL/uL   Hemoglobin 10.7 (L) 12.0 - 15.0 g/dL   HCT 40.9 (L) 81.1 - 91.4 %   MCV 100.0 80.0 - 100.0 fL   MCH 32.4 26.0 - 34.0 pg   MCHC 32.4 30.0 - 36.0 g/dL   RDW 78.2 95.6 - 21.3 %   Platelets 156 150 - 400 K/uL   nRBC 0.0 0.0 - 0.2  %  Basic metabolic panel     Status: Abnormal   Collection Time: 12/31/17  9:11 AM  Result Value Ref Range   Sodium 137 135 - 145 mmol/L   Potassium 4.2 3.5 - 5.1 mmol/L   Chloride 107 98 - 111 mmol/L   CO2 22 22 - 32 mmol/L   Glucose, Bld 99 70 - 99 mg/dL   BUN 9 6 - 20 mg/dL   Creatinine, Ser 0.86 0.44 - 1.00 mg/dL   Calcium 8.2 (L) 8.9 - 10.3 mg/dL   GFR calc non Af Amer >60 >60 mL/min   GFR calc Af Amer >60 >60 mL/min   Anion gap 8 5 - 15    Imaging:  No results found.  MAU Course: Orders Placed This Encounter  Procedures  . Urinalysis, Routine w reflex microscopic  . CBC  . Basic metabolic panel  . Discharge patient   Meds ordered this encounter  Medications  . lactated ringers bolus 1,000 mL  . ondansetron (ZOFRAN) injection 4 mg  . meclizine (ANTIVERT) tablet 50 mg  . ondansetron (ZOFRAN ODT) 4 MG disintegrating tablet    Sig: Take 1 tablet (4 mg total) by mouth every 8 (eight) hours as needed for nausea or vomiting.    Dispense:  15 tablet    Refill:  0    Order Specific Question:   Supervising Provider    Answer:   Adam Phenix [[id number]]  . meclizine (ANTIVERT) 25 MG tablet    Sig: Take 1 tablet (25 mg total) by mouth 3 (three) times daily as needed for dizziness.    Dispense:  30 tablet    Refill:  0    Order Specific Question:   Supervising Provider    Answer:   Adam Phenix [[id number]]    MDM: Reactive NST VSS, pt not orthostatic IV fluids, zofran, & meclizine given. Pt reports improvement in symptoms.  Pt anemic but hemoglobin stable. Encouraged to continue iron supplementation Rx zofran & meclizine. Increase fluid intake at home.   Assessment: 1. Vestibular dizziness   2. [redacted] weeks gestation of pregnancy     Plan: Discharge home in stable condition.     Allergies as of 12/31/2017      Reactions   Codeine Itching      Medication List    STOP taking these medications   ADVIL MIGRAINE PO   famotidine 20 MG tablet Commonly known as:   PEPCID   ibuprofen 600 MG tablet Commonly known as:  ADVIL,MOTRIN   loratadine-pseudoephedrine 10-240 MG 24 hr tablet Commonly known as:  CLARITIN-D 24-hour  multivitamin with minerals Tabs tablet   OB COMPLETE PETITE 35-5-1-200 MG Caps   prenatal multivitamin Tabs tablet   SINUS PO     TAKE these medications   butalbital-acetaminophen-caffeine 50-325-40 MG tablet Commonly known as:  FIORICET, ESGIC Take 1-2 tablets by mouth every 6 (six) hours as needed.   CITRANATAL BLOOM 90-1 MG Tabs Take 1 tablet by mouth daily.   docusate sodium 100 MG capsule Commonly known as:  COLACE Take 100 mg by mouth 2 (two) times daily.   ferrous sulfate 325 (65 FE) MG EC tablet Take 325 mg by mouth 3 (three) times daily with meals.   meclizine 25 MG tablet Commonly known as:  ANTIVERT Take 1 tablet (25 mg total) by mouth 3 (three) times daily as needed for dizziness.   ondansetron 4 MG disintegrating tablet Commonly known as:  ZOFRAN-ODT Take 1 tablet (4 mg total) by mouth every 8 (eight) hours as needed for nausea or vomiting.       Judeth Horn, NP 12/31/2017 4:20 PM

## 2017-12-31 NOTE — Discharge Instructions (Signed)
Vertigo °Vertigo is the feeling that you or your surroundings are moving when they are not. Vertigo can be dangerous if it occurs while you are doing something that could endanger you or others, such as driving. °What are the causes? °This condition is caused by a disturbance in the signals that are sent by your body’s sensory systems to your brain. Different causes of a disturbance can lead to vertigo, including: °· Infections, especially in the inner ear. °· A bad reaction to a drug, or misuse of alcohol and medicines. °· Withdrawal from drugs or alcohol. °· Quickly changing positions, as when lying down or rolling over in bed. °· Migraine headaches. °· Decreased blood flow to the brain. °· Decreased blood pressure. °· Increased pressure in the brain from a head or neck injury, stroke, infection, tumor, or bleeding. °· Central nervous system disorders. ° °What are the signs or symptoms? °Symptoms of this condition usually occur when you move your head or your eyes in different directions. Symptoms may start suddenly, and they usually last for less than a minute. Symptoms may include: °· Loss of balance and falling. °· Feeling like you are spinning or moving. °· Feeling like your surroundings are spinning or moving. °· Nausea and vomiting. °· Blurred vision or double vision. °· Difficulty hearing. °· Slurred speech. °· Dizziness. °· Involuntary eye movement (nystagmus). ° °Symptoms can be mild and cause only slight annoyance, or they can be severe and interfere with daily life. Episodes of vertigo may return (recur) over time, and they are often triggered by certain movements. Symptoms may improve over time. °How is this diagnosed? °This condition may be diagnosed based on medical history and the quality of your nystagmus. Your health care provider may test your eye movements by asking you to quickly change positions to trigger the nystagmus. This may be called the Dix-Hallpike test, head thrust test, or roll test.  You may be referred to a health care provider who specializes in ear, nose, and throat (ENT) problems (otolaryngologist) or a provider who specializes in disorders of the central nervous system (neurologist). °You may have additional testing, including: °· A physical exam. °· Blood tests. °· MRI. °· A CT scan. °· An electrocardiogram (ECG). This records electrical activity in your heart. °· An electroencephalogram (EEG). This records electrical activity in your brain. °· Hearing tests. ° °How is this treated? °Treatment for this condition depends on the cause and the severity of the symptoms. Treatment options include: °· Medicines to treat nausea or vertigo. These are usually used for severe cases. Some medicines that are used to treat other conditions may also reduce or eliminate vertigo symptoms. These include: °? Medicines that control allergies (antihistamines). °? Medicines that control seizures (anticonvulsants). °? Medicines that relieve depression (antidepressants). °? Medicines that relieve anxiety (sedatives). °· Head movements to adjust your inner ear back to normal. If your vertigo is caused by an ear problem, your health care provider may recommend certain movements to correct the problem. °· Surgery. This is rare. ° °Follow these instructions at home: °Safety °· Move slowly.Avoid sudden body or head movements. °· Avoid driving. °· Avoid operating heavy machinery. °· Avoid doing any tasks that would cause danger to you or others if you would have a vertigo episode during the task. °· If you have trouble walking or keeping your balance, try using a cane for stability. If you feel dizzy or unstable, sit down right away. °· Return to your normal activities as told by your   health care provider. Ask your health care provider what activities are safe for you. General instructions  Take over-the-counter and prescription medicines only as told by your health care provider.  Avoid certain positions or  movements as told by your health care provider.  Drink enough fluid to keep your urine clear or pale yellow.  Keep all follow-up visits as told by your health care provider. This is important. Contact a health care provider if:  Your medicines do not relieve your vertigo or they make it worse.  You have a fever.  Your condition gets worse or you develop new symptoms.  Your family or friends notice any behavioral changes.  Your nausea or vomiting gets worse.  You have numbness or a pins and needles sensation in part of your body. Get help right away if:  You have difficulty moving or speaking.  You are always dizzy.  You faint.  You develop severe headaches.  You have weakness in your hands, arms, or legs.  You have changes in your hearing or vision.  You develop a stiff neck.  You develop sensitivity to light. This information is not intended to replace advice given to you by your health care provider. Make sure you discuss any questions you have with your health care provider. Document Released: 11/28/2004 Document Revised: 08/02/2015 Document Reviewed: 06/13/2014 Elsevier Interactive Patient Education  2018 [employer].     Fetal Movement Counts Patient Name: ________________________________________________ Patient Due Date: ____________________ What is a fetal movement count? A fetal movement count is the number of times that you feel your baby move during a certain amount of time. This may also be called a fetal kick count. A fetal movement count is recommended for every pregnant woman. You may be asked to start counting fetal movements as early as week 28 of your pregnancy. Pay attention to when your baby is most active. You may notice your baby's sleep and wake cycles. You may also notice things that make your baby move more. You should do a fetal movement count:  When your baby is normally most active.  At the same time each day.  A good time to count  movements is while you are resting, after having something to eat and drink. How do I count fetal movements? 1. Find a quiet, comfortable area. Sit, or lie down on your side. 2. Write down the date, the start time and stop time, and the number of movements that you felt between those two times. Take this information with you to your health care visits. 3. For 2 hours, count kicks, flutters, swishes, rolls, and jabs. You should feel at least 10 movements during 2 hours. 4. You may stop counting after you have felt 10 movements. 5. If you do not feel 10 movements in 2 hours, have something to eat and drink. Then, keep resting and counting for 1 hour. If you feel at least 4 movements during that hour, you may stop counting. Contact a health care provider if:  You feel fewer than 4 movements in 2 hours.  Your baby is not moving like he or she usually does. Date: ____________ Start time: ____________ Stop time: ____________ Movements: ____________ Date: ____________ Start time: ____________ Stop time: ____________ Movements: ____________ Date: ____________ Start time: ____________ Stop time: ____________ Movements: ____________ Date: ____________ Start time: ____________ Stop time: ____________ Movements: ____________ Date: ____________ Start time: ____________ Stop time: ____________ Movements: ____________ Date: ____________ Start time: ____________ Stop time: ____________ Movements: ____________ Date: ____________ Start  time: ____________ Stop time: ____________ Movements: ____________ Date: ____________ Start time: ____________ Stop time: ____________ Movements: ____________ Date: ____________ Start time: ____________ Stop time: ____________ Movements: ____________ This information is not intended to replace advice given to you by your health care provider. Make sure you discuss any questions you have with your health care provider. Document Released: 03/20/2006 Document Revised: 10/18/2015  Document Reviewed: 03/30/2015 Elsevier Interactive Patient Education  [employer].

## 2018-01-01 ENCOUNTER — Encounter: Payer: Self-pay | Admitting: Certified Nurse Midwife

## 2018-01-01 ENCOUNTER — Ambulatory Visit (INDEPENDENT_AMBULATORY_CARE_PROVIDER_SITE_OTHER): Payer: Medicaid Other | Admitting: Certified Nurse Midwife

## 2018-01-01 VITALS — BP 112/70 | HR 92 | Wt 212.0 lb

## 2018-01-01 DIAGNOSIS — H8190 Unspecified disorder of vestibular function, unspecified ear: Secondary | ICD-10-CM

## 2018-01-01 DIAGNOSIS — O4440 Low lying placenta NOS or without hemorrhage, unspecified trimester: Secondary | ICD-10-CM

## 2018-01-01 DIAGNOSIS — Z3480 Encounter for supervision of other normal pregnancy, unspecified trimester: Secondary | ICD-10-CM

## 2018-01-01 DIAGNOSIS — O4443 Low lying placenta NOS or without hemorrhage, third trimester: Secondary | ICD-10-CM

## 2018-01-01 DIAGNOSIS — R42 Dizziness and giddiness: Secondary | ICD-10-CM

## 2018-01-01 NOTE — Progress Notes (Signed)
   PRENATAL VISIT NOTE  Subjective:  Selena Malone is a 27 y.o. G3P1011 at [redacted]w[redacted]d being seen today for ongoing prenatal care.  She is currently monitored for the following issues for this low-risk pregnancy and has Supervision of normal pregnancy, antepartum; Low blood hemoglobin A2; and Low lying placenta, antepartum on their problem list.  Patient reports no complaints.  Contractions: Irregular. Vag. Bleeding: None.  Movement: Present. Denies leaking of fluid.   The following portions of the patient's history were reviewed and updated as appropriate: allergies, current medications, past family history, past medical history, past social history, past surgical history and problem list. Problem list updated.  Objective:   Vitals:   01/01/18 0834  BP: 112/70  Pulse: 92  Weight: 212 lb (96.2 kg)    Fetal Status: Fetal Heart Rate (bpm): 152 Fundal Height: 33 cm Movement: Present     General:  Alert, oriented and cooperative. Patient is in no acute distress.  Skin: Skin is warm and dry. No rash noted.   Cardiovascular: Normal heart rate noted  Respiratory: Normal respiratory effort, no problems with respiration noted  Abdomen: Soft, gravid, appropriate for gestational age.  Pain/Pressure: Present     Pelvic: Cervical exam deferred        Extremities: Normal range of motion.  Edema: Mild pitting, slight indentation  Mental Status: Normal mood and affect. Normal behavior. Normal judgment and thought content.   Assessment and Plan:  Pregnancy: G3P1011 at [redacted]w[redacted]d  1. Supervision of other normal pregnancy, antepartum - Patient doing well, no complaints  - Was seen in [facility] yesterday and diagnosed with Vertigo, started on Meclizine  - Patient reports she has not needed medication since yesterday, feel much better today  - Educated on increased consumption of water and elevating legs when sitting for edema  - Anticipatory guidance on upcoming appointments  2. Low lying placenta,  antepartum - Low lying placenta resolved  - Placenta posterior on 10/23 [place]    Preterm labor symptoms and general obstetric precautions including but not limited to vaginal bleeding, contractions, leaking of fluid and fetal movement were reviewed in detail with the patient. Please refer to After Visit Summary for other counseling recommendations.  Return in about 2 weeks (around 01/15/2018) for ROB.  Future Appointments  Date Time Provider Department Center  01/14/2018 10:15 AM Sharyon Cable, CNM [facility] None    Sharyon Cable, CNM

## 2018-01-01 NOTE — Progress Notes (Signed)
Pt seen at [facility] yesterday.

## 2018-01-14 ENCOUNTER — Ambulatory Visit (INDEPENDENT_AMBULATORY_CARE_PROVIDER_SITE_OTHER): Payer: Medicaid Other | Admitting: Certified Nurse Midwife

## 2018-01-14 ENCOUNTER — Encounter: Payer: Medicaid Other | Admitting: Certified Nurse Midwife

## 2018-01-14 ENCOUNTER — Encounter: Payer: Self-pay | Admitting: Certified Nurse Midwife

## 2018-01-14 VITALS — BP 125/73 | HR 122 | Wt 216.6 lb

## 2018-01-14 DIAGNOSIS — Z3480 Encounter for supervision of other normal pregnancy, unspecified trimester: Secondary | ICD-10-CM

## 2018-01-14 DIAGNOSIS — R42 Dizziness and giddiness: Secondary | ICD-10-CM

## 2018-01-14 DIAGNOSIS — Z3483 Encounter for supervision of other normal pregnancy, third trimester: Secondary | ICD-10-CM

## 2018-01-14 NOTE — Progress Notes (Signed)
Pt presents for a ROB. Pt has complains of continuing to have vertigo. She states that the medication is not working for her.

## 2018-01-14 NOTE — Patient Instructions (Signed)

## 2018-01-14 NOTE — Progress Notes (Signed)
   PRENATAL VISIT NOTE  Subjective:  Selena Malone is a 27 y.o. G3P1011 at 4566w0d being seen today for ongoing prenatal care.  She is currently monitored for the following issues for this low-risk pregnancy and has Supervision of normal pregnancy, antepartum; Low blood hemoglobin A2; Low lying placenta, antepartum; and Vestibular dizziness on their problem list.  Patient reports continued vertigo.  Contractions: Irregular. Vag. Bleeding: None.  Movement: Present. Denies leaking of fluid.   The following portions of the patient's history were reviewed and updated as appropriate: allergies, current medications, past family history, past medical history, past social history, past surgical history and problem list. Problem list updated.  Objective:   Vitals:   01/14/18 1017  BP: 125/73  Pulse: (!) 122  Weight: 216 lb 9.6 oz (98.2 kg)    Fetal Status: Fetal Heart Rate (bpm): 150 Fundal Height: 35 cm Movement: Present     General:  Alert, oriented and cooperative. Patient is in no acute distress.  Skin: Skin is warm and dry. No rash noted.   Cardiovascular: Normal heart rate noted  Respiratory: Normal respiratory effort, no problems with respiration noted  Abdomen: Soft, gravid, appropriate for gestational age.  Pain/Pressure: Present     Pelvic: Cervical exam deferred        Extremities: Normal range of motion.  Edema: Trace  Mental Status: Normal mood and affect. Normal behavior. Normal judgment and thought content.   Assessment and Plan:  Pregnancy: G3P1011 at 4666w0d  1. Supervision of other normal pregnancy, antepartum - Patient doing well other than complaints of vertigo  - Anticipatory guidance on upcoming appointments- f/u US for fetal growth scheduled  - US MFM OB FOLLOW UP; Future  2. Vertigo - Patient complains of continued vertigo- currently taking Antivert but reports medication is not helping  - Ambulatory referral to ENT  Preterm labor symptoms and general  obstetric precautions including but not limited to vaginal bleeding, contractions, leaking of fluid and fetal movement were reviewed in detail with the patient. Please refer to After Visit Summary for other counseling recommendations.  Return in about 2 weeks (around 01/28/2018) for ROB.  Future Appointments  Date Time Provider Department Center  01/28/2018  1:00 PM Hurshel PartyLeftwich-Kirby, Lisa A, CNM [facility] None  02/05/2018  9:45 AM [facility] US 5 [facility]    Sharyon CableVeronica C Doriann Zuch, CNM

## 2018-01-28 ENCOUNTER — Other Ambulatory Visit (HOSPITAL_COMMUNITY)
Admission: RE | Admit: 2018-01-28 | Discharge: 2018-01-28 | Disposition: A | Discharge status: Home / Self Care | Payer: Medicaid Other | Source: Ambulatory Visit | Attending: Advanced Practice Midwife | Admitting: Advanced Practice Midwife

## 2018-01-28 ENCOUNTER — Ambulatory Visit (INDEPENDENT_AMBULATORY_CARE_PROVIDER_SITE_OTHER): Payer: Medicaid Other | Admitting: Advanced Practice Midwife

## 2018-01-28 VITALS — BP 107/71 | HR 101 | Wt 219.4 lb

## 2018-01-28 DIAGNOSIS — Z3483 Encounter for supervision of other normal pregnancy, third trimester: Secondary | ICD-10-CM

## 2018-01-28 DIAGNOSIS — Z3480 Encounter for supervision of other normal pregnancy, unspecified trimester: Secondary | ICD-10-CM | POA: Insufficient documentation

## 2018-01-28 DIAGNOSIS — O479 False labor, unspecified: Secondary | ICD-10-CM

## 2018-01-28 DIAGNOSIS — Z3A36 36 weeks gestation of pregnancy: Secondary | ICD-10-CM

## 2018-01-28 DIAGNOSIS — R42 Dizziness and giddiness: Secondary | ICD-10-CM

## 2018-01-28 DIAGNOSIS — Z3009 Encounter for other general counseling and advice on contraception: Secondary | ICD-10-CM

## 2018-01-28 NOTE — Progress Notes (Signed)
Patient reports good fetal movement with irregular contractions.  

## 2018-01-28 NOTE — Patient Instructions (Addendum)
Primary Care offices: [facility] [address] [place], [place] [address]  Main: [phone]   [facility] [address] [place], [place], [place] [address]  Phone: [phone]     Labor Precautions Reasons to come to MAU:  1.  Contractions are  5 minutes apart or less, each last 1 minute, these have been going on for 1-2 hours, and you cannot walk or talk during them 2.  You have a large gush of fluid, or a trickle of fluid that will not stop and you have to wear a pad 3.  You have bleeding that is bright red, heavier than spotting--like menstrual bleeding (spotting can be normal in early labor or after a check of your cervix) 4.  You do not feel the baby moving like he/she normally does   Third Trimester of Pregnancy The third trimester is from week 28 through week 40 (months 7 through 9). The third trimester is a time when the unborn baby (fetus) is growing rapidly. At the end of the ninth month, the fetus is about 20 inches in length and weighs 6-10 pounds. Body changes during your third trimester Your body will continue to go through many changes during pregnancy. The changes vary from woman to woman. During the third trimester:  Your weight will continue to increase. You can expect to gain 25-35 pounds (11-16 kg) by the end of the pregnancy.  You may begin to get stretch marks on your hips, abdomen, and breasts.  You may urinate more often because the fetus is moving lower into your pelvis and pressing on your bladder.  You may develop or continue to have heartburn. This is caused by increased hormones that slow down muscles in the digestive tract.  You may develop or continue to have constipation because increased hormones slow digestion and cause the muscles that push waste through your intestines to relax.  You may develop hemorrhoids. These are swollen veins (varicose veins) in the rectum that can itch or be painful.  You may  develop swollen, bulging veins (varicose veins) in your legs.  You may have increased body aches in the pelvis, back, or thighs. This is due to weight gain and increased hormones that are relaxing your joints.  You may have changes in your hair. These can include thickening of your hair, rapid growth, and changes in texture. Some women also have hair loss during or after pregnancy, or hair that feels dry or thin. Your hair will most likely return to normal after your baby is born.  Your breasts will continue to grow and they will continue to become tender. A yellow fluid (colostrum) may leak from your breasts. This is the first milk you are producing for your baby.  Your belly button may stick out.  You may notice more swelling in your hands, face, or ankles.  You may have increased tingling or numbness in your hands, arms, and legs. The skin on your belly may also feel numb.  You may feel short of breath because of your expanding uterus.  You may have more problems sleeping. This can be caused by the size of your belly, increased need to urinate, and an increase in your body's metabolism.  You may notice the fetus "dropping," or moving lower in your abdomen (lightening).  You may have increased vaginal discharge.  You may notice your joints feel loose and you may have pain around your pelvic bone.  What to expect at  prenatal visits You will have prenatal exams every 2 weeks until week 36. Then you will have weekly prenatal exams. During a routine prenatal visit:  You will be weighed to make sure you and the baby are growing normally.  Your blood pressure will be taken.  Your abdomen will be measured to track your baby's growth.  The fetal heartbeat will be listened to.  Any test results from the previous visit will be discussed.  You may have a cervical check near your due date to see if your cervix has softened or thinned (effaced).  You will be tested for Group B  streptococcus. This happens between 35 and 37 weeks.  Your health care provider may ask you:  What your birth plan is.  How you are feeling.  If you are feeling the baby move.  If you have had any abnormal symptoms, such as leaking fluid, bleeding, severe headaches, or abdominal cramping.  If you are using any tobacco products, including cigarettes, chewing tobacco, and electronic cigarettes.  If you have any questions.  Other tests or screenings that may be performed during your third trimester include:  Blood tests that check for low iron levels (anemia).  Fetal testing to check the health, activity level, and growth of the fetus. Testing is done if you have certain medical conditions or if there are problems during the pregnancy.  Nonstress test (NST). This test checks the health of your baby to make sure there are no signs of problems, such as the baby not getting enough oxygen. During this test, a belt is placed around your belly. The baby is made to move, and its heart rate is monitored during movement.  What is false labor? False labor is a condition in which you feel small, irregular tightenings of the muscles in the womb (contractions) that usually go away with rest, changing position, or drinking water. These are called Braxton Hicks contractions. Contractions may last for hours, days, or even weeks before true labor sets in. If contractions come at regular intervals, become more frequent, increase in intensity, or become painful, you should see your health care provider. What are the signs of labor?  Abdominal cramps.  Regular contractions that start at 10 minutes apart and become stronger and more frequent with time.  Contractions that start on the top of the uterus and spread down to the lower abdomen and back.  Increased pelvic pressure and dull back pain.  A watery or bloody mucus discharge that comes from the vagina.  Leaking of amniotic fluid. This is also known  as your "water breaking." It could be a slow trickle or a gush. Let your health care provider know if it has a color or strange odor. If you have any of these signs, call your health care provider right away, even if it is before your due date. Follow these instructions at home: Medicines  Follow your health care provider's instructions regarding medicine use. Specific medicines may be either safe or unsafe to take during pregnancy.  Take a prenatal vitamin that contains at least 600 micrograms (mcg) of folic acid.  If you develop constipation, try taking a stool softener if your health care provider approves. Eating and drinking  Eat a balanced diet that includes fresh fruits and vegetables, whole grains, good sources of protein such as meat, eggs, or tofu, and low-fat dairy. Your health care provider will help you determine the amount of weight gain that is right for you.  Avoid raw meat  and uncooked cheese. These carry germs that can cause birth defects in the baby.  If you have low calcium intake from food, talk to your health care provider about whether you should take a daily calcium supplement.  Eat four or five small meals rather than three large meals a day.  Limit foods that are high in fat and processed sugars, such as fried and sweet foods.  To prevent constipation: ? Drink enough fluid to keep your urine clear or pale yellow. ? Eat foods that are high in fiber, such as fresh fruits and vegetables, whole grains, and beans. Activity  Exercise only as directed by your health care provider. Most women can continue their usual exercise routine during pregnancy. Try to exercise for 30 minutes at least 5 days a week. Stop exercising if you experience uterine contractions.  Avoid heavy lifting.  Do not exercise in extreme heat or humidity, or at high altitudes.  Wear low-heel, comfortable shoes.  Practice good posture.  You may continue to have sex unless your health care  provider tells you otherwise. Relieving pain and discomfort  Take frequent breaks and rest with your legs elevated if you have leg cramps or low back pain.  Take warm sitz baths to soothe any pain or discomfort caused by hemorrhoids. Use hemorrhoid cream if your health care provider approves.  Wear a good support bra to prevent discomfort from breast tenderness.  If you develop varicose veins: ? Wear support pantyhose or compression stockings as told by your healthcare provider. ? Elevate your feet for 15 minutes, 3-4 times a day. Prenatal care  Write down your questions. Take them to your prenatal visits.  Keep all your prenatal visits as told by your health care provider. This is important. Safety  Wear your seat belt at all times when driving.  Make a list of emergency phone numbers, including numbers for family, friends, the hospital, and police and fire departments. General instructions  Avoid cat litter boxes and soil used by cats. These carry germs that can cause birth defects in the baby. If you have a cat, ask someone to clean the litter box for you.  Do not travel far distances unless it is absolutely necessary and only with the approval of your health care provider.  Do not use hot tubs, steam rooms, or saunas.  Do not drink alcohol.  Do not use any products that contain nicotine or tobacco, such as cigarettes and e-cigarettes. If you need help quitting, ask your health care provider.  Do not use any medicinal herbs or unprescribed drugs. These chemicals affect the formation and growth of the baby.  Do not douche or use tampons or scented sanitary pads.  Do not cross your legs for long periods of time.  To prepare for the arrival of your baby: ? Take prenatal classes to understand, practice, and ask questions about labor and delivery. ? Make a trial run to the hospital. ? Visit the hospital and tour the maternity area. ? Arrange for maternity or paternity leave  through employers. ? Arrange for family and friends to take care of pets while you are in the hospital. ? Purchase a rear-facing car seat and make sure you know how to install it in your car. ? Pack your hospital bag. ? Prepare the baby's nursery. Make sure to remove all pillows and stuffed animals from the baby's crib to prevent suffocation.  Visit your dentist if you have not gone during your pregnancy. Use a soft  toothbrush to brush your teeth and be gentle when you floss. Contact a health care provider if:  You are unsure if you are in labor or if your water has broken.  You become dizzy.  You have mild pelvic cramps, pelvic pressure, or nagging pain in your abdominal area.  You have lower back pain.  You have persistent nausea, vomiting, or diarrhea.  You have an unusual or bad smelling vaginal discharge.  You have pain when you urinate. Get help right away if:  Your water breaks before 37 weeks.  You have regular contractions less than 5 minutes apart before 37 weeks.  You have a fever.  You are leaking fluid from your vagina.  You have spotting or bleeding from your vagina.  You have severe abdominal pain or cramping.  You have rapid weight loss or weight gain.  You have shortness of breath with chest pain.  You notice sudden or extreme swelling of your face, hands, ankles, feet, or legs.  Your baby makes fewer than 10 movements in 2 hours.  You have severe headaches that do not go away when you take medicine.  You have vision changes. Summary  The third trimester is from week 28 through week 40, months 7 through 9. The third trimester is a time when the unborn baby (fetus) is growing rapidly.  During the third trimester, your discomfort may increase as you and your baby continue to gain weight. You may have abdominal, leg, and back pain, sleeping problems, and an increased need to urinate.  During the third trimester your breasts will keep growing and they  will continue to become tender. A yellow fluid (colostrum) may leak from your breasts. This is the first milk you are producing for your baby.  False labor is a condition in which you feel small, irregular tightenings of the muscles in the womb (contractions) that eventually go away. These are called Braxton Hicks contractions. Contractions may last for hours, days, or even weeks before true labor sets in.  Signs of labor can include: abdominal cramps; regular contractions that start at 10 minutes apart and become stronger and more frequent with time; watery or bloody mucus discharge that comes from the vagina; increased pelvic pressure and dull back pain; and leaking of amniotic fluid. This information is not intended to replace advice given to you by your health care provider. Make sure you discuss any questions you have with your health care provider. Document Released: 02/12/2001 Document Revised: 07/27/2015 Document Reviewed: 04/21/2012 Elsevier Interactive Patient Education  2017 [employer].

## 2018-01-28 NOTE — Progress Notes (Signed)
   PRENATAL VISIT NOTE  Subjective:  Selena Malone is a 27 y.o. G3P1011 at 3880w0d being seen today for ongoing prenatal care.  She is currently monitored for the following issues for this low-risk pregnancy and has Supervision of normal pregnancy, antepartum; Low blood hemoglobin A2; Low lying placenta, antepartum; Vestibular dizziness; and Unwanted fertility on their problem list.  Patient reports occasional contractions and vertigo.  Contractions: Irregular. Vag. Bleeding: None.  Movement: Present. Denies leaking of fluid.   The following portions of the patient's history were reviewed and updated as appropriate: allergies, current medications, past family history, past medical history, past social history, past surgical history and problem list. Problem list updated.  Objective:   Vitals:   01/28/18 1308  BP: 107/71  Pulse: (!) 101  Weight: 99.5 kg    Fetal Status: Fetal Heart Rate (bpm): 141 Fundal Height: 36 cm Movement: Present  Presentation: Vertex  General:  Alert, oriented and cooperative. Patient is in no acute distress.  Skin: Skin is warm and dry. No rash noted.   Cardiovascular: Normal heart rate noted  Respiratory: Normal respiratory effort, no problems with respiration noted  Abdomen: Soft, gravid, appropriate for gestational age.  Pain/Pressure: Present     Pelvic: Cervical exam performed Dilation: Fingertip Effacement (%): 0 Station: Ballotable  Extremities: Normal range of motion.  Edema: Trace  Mental Status: Normal mood and affect. Normal behavior. Normal judgment and thought content.   Assessment and Plan:  Pregnancy: G3P1011 at 4180w0d  1. Supervision of other normal pregnancy, antepartum --Anticipatory guidance about next visits/weeks of pregnancy given. - Strep Gp B NAA - Cervicovaginal ancillary only( [place])  2. Vertigo --Still occurs every morning and sometimes during the day with sudden head movements or standing up/walking too fast.     --Referral was made to ENT but no appts until April. --Neuro exam wnl today.  Symptom is positional, related to head movements so likely benign positional vertigo. --Pt to see primary care for further evaluation/treatment.  LexicographerContact [facility].  3. Unwanted fertility --BTL papers signed  4. Braxton Hicks contractions --Contractions Q 1 hour for last 24 hours. --Cervix FT/long/ballotable today, no evidence of labor. --Preterm labor precautions reviewed.  Preterm labor symptoms and general obstetric precautions including but not limited to vaginal bleeding, contractions, leaking of fluid and fetal movement were reviewed in detail with the patient. Please refer to After Visit Summary for other counseling recommendations.  Return in about 2 weeks (around 02/11/2018).  Future Appointments  Date Time Provider Department Center  02/05/2018  9:45 AM [facility] US 5 [facility]    Sharen CounterLisa Leftwich-Kirby, CNM

## 2018-01-30 LAB — CERVICOVAGINAL ANCILLARY ONLY
Chlamydia: NEGATIVE
Neisseria Gonorrhea: NEGATIVE

## 2018-01-30 LAB — STREP GP B NAA: Strep Gp B NAA: NEGATIVE

## 2018-02-04 ENCOUNTER — Ambulatory Visit (INDEPENDENT_AMBULATORY_CARE_PROVIDER_SITE_OTHER): Payer: Medicaid Other | Admitting: Certified Nurse Midwife

## 2018-02-04 ENCOUNTER — Encounter: Payer: Self-pay | Admitting: Certified Nurse Midwife

## 2018-02-04 VITALS — BP 118/74 | HR 97 | Wt 218.6 lb

## 2018-02-04 DIAGNOSIS — Z3483 Encounter for supervision of other normal pregnancy, third trimester: Secondary | ICD-10-CM

## 2018-02-04 DIAGNOSIS — Z3A37 37 weeks gestation of pregnancy: Secondary | ICD-10-CM

## 2018-02-04 DIAGNOSIS — Z3480 Encounter for supervision of other normal pregnancy, unspecified trimester: Secondary | ICD-10-CM

## 2018-02-04 NOTE — Progress Notes (Signed)
Pt presents for ROB. Pt has no complaints. 

## 2018-02-04 NOTE — Progress Notes (Signed)
   PRENATAL VISIT NOTE  Subjective:  Selena Malone is a 27 y.o. G3P1011 at 4754w0d being seen today for ongoing prenatal care.  She is currently monitored for the following issues for this low-risk pregnancy and has Supervision of normal pregnancy, antepartum; Low blood hemoglobin A2; Low lying placenta, antepartum; Vestibular dizziness; and Unwanted fertility on their problem list.  Patient reports occasional contractions.  Contractions: Irregular. Vag. Bleeding: None.  Movement: Present. Denies leaking of fluid.   The following portions of the patient's history were reviewed and updated as appropriate: allergies, current medications, past family history, past medical history, past social history, past surgical history and problem list. Problem list updated.  Objective:   Vitals:   02/04/18 0952  BP: 118/74  Pulse: 97  Weight: 218 lb 9.6 oz (99.2 kg)    Fetal Status: Fetal Heart Rate (bpm): 145 Fundal Height: 36 cm Movement: Present     General:  Alert, oriented and cooperative. Patient is in no acute distress.  Skin: Skin is warm and dry. No rash noted.   Cardiovascular: Normal heart rate noted  Respiratory: Normal respiratory effort, no problems with respiration noted  Abdomen: Soft, gravid, appropriate for gestational age.  Pain/Pressure: Present     Pelvic: Cervical exam deferred        Extremities: Normal range of motion.  Edema: Trace  Mental Status: Normal mood and affect. Normal behavior. Normal judgment and thought content.   Assessment and Plan:  Pregnancy: G3P1011 at 5654w0d  1. Supervision of other normal pregnancy, antepartum - Patient doing well no complaints, reports occasional BH contractions over the past 2-3 days  - Anticipatory guidance on upcoming appointments  - GBS negative  - Labor precautions and reasons to go to MAU for evaluation discussed   Term labor symptoms and general obstetric precautions including but not limited to vaginal bleeding,  contractions, leaking of fluid and fetal movement were reviewed in detail with the patient. Please refer to After Visit Summary for other counseling recommendations.  Return in about 1 week (around 02/11/2018) for ROB.  Future Appointments  Date Time Provider Department Center  02/05/2018  9:45 AM [facility] US 5 [facility]  02/11/2018 10:15 AM Sharyon Cableogers, Ezekeil Bethel C, CNM [facility] None  02/19/2018  9:45 AM Sharyon Cableogers, Faline Langer C, CNM [facility] None    Sharyon CableVeronica C Jahira Swiss, CNM

## 2018-02-04 NOTE — Patient Instructions (Signed)
Reasons to go to MAU:  1.  Contractions are  4-5 minutes apart or less, each last 1 minute, these have been going on for 1-2 hours, and you cannot walk or talk during them 2.  You have a large gush of fluid, or a trickle of fluid that will not stop and you have to wear a pad 3.  You have bleeding that is bright red, heavier than spotting--like menstrual bleeding (spotting can be normal in early labor or after a check of your cervix) 4.  You do not feel the baby moving like he/she normally does  

## 2018-02-05 ENCOUNTER — Ambulatory Visit (HOSPITAL_COMMUNITY)
Admission: RE | Admit: 2018-02-05 | Discharge: 2018-02-05 | Disposition: A | Discharge status: Home / Self Care | Payer: Medicaid Other | Source: Ambulatory Visit | Attending: Certified Nurse Midwife | Admitting: Certified Nurse Midwife

## 2018-02-05 DIAGNOSIS — Z3480 Encounter for supervision of other normal pregnancy, unspecified trimester: Secondary | ICD-10-CM

## 2018-02-05 DIAGNOSIS — O99213 Obesity complicating pregnancy, third trimester: Secondary | ICD-10-CM

## 2018-02-05 DIAGNOSIS — Z3A37 37 weeks gestation of pregnancy: Secondary | ICD-10-CM

## 2018-02-05 DIAGNOSIS — Z362 Encounter for other antenatal screening follow-up: Secondary | ICD-10-CM | POA: Diagnosis present

## 2018-02-05 DIAGNOSIS — O4443 Low lying placenta NOS or without hemorrhage, third trimester: Secondary | ICD-10-CM

## 2018-02-11 ENCOUNTER — Ambulatory Visit (INDEPENDENT_AMBULATORY_CARE_PROVIDER_SITE_OTHER): Payer: Medicaid Other | Admitting: Certified Nurse Midwife

## 2018-02-11 ENCOUNTER — Encounter: Payer: Self-pay | Admitting: Certified Nurse Midwife

## 2018-02-11 VITALS — BP 131/79 | HR 101 | Wt 220.4 lb

## 2018-02-11 DIAGNOSIS — Z3483 Encounter for supervision of other normal pregnancy, third trimester: Secondary | ICD-10-CM

## 2018-02-11 DIAGNOSIS — Z3480 Encounter for supervision of other normal pregnancy, unspecified trimester: Secondary | ICD-10-CM

## 2018-02-11 DIAGNOSIS — Z3009 Encounter for other general counseling and advice on contraception: Secondary | ICD-10-CM

## 2018-02-11 NOTE — Progress Notes (Signed)
Pt here for ROB G3P1 5879w0d.

## 2018-02-11 NOTE — Patient Instructions (Signed)

## 2018-02-11 NOTE — Progress Notes (Signed)
   PRENATAL VISIT NOTE  Subjective:  Selena Malone is a 27 y.o. G3P1011 at 4513w0d being seen today for ongoing prenatal care.  She is currently monitored for the following issues for this low-risk pregnancy and has Supervision of normal pregnancy, antepartum; Low blood hemoglobin A2; Low lying placenta, antepartum; Vestibular dizziness; and Unwanted fertility on their problem list.  Patient reports occasional contractions.  Contractions: Irregular. Vag. Bleeding: None.  Movement: Present. Denies leaking of fluid.   The following portions of the patient's history were reviewed and updated as appropriate: allergies, current medications, past family history, past medical history, past social history, past surgical history and problem list. Problem list updated.  Objective:   Vitals:   02/11/18 1018  BP: 131/79  Pulse: (!) 101  Weight: 220 lb 6.4 oz (100 kg)    Fetal Status: Fetal Heart Rate (bpm): 145 Fundal Height: 37 cm Movement: Present  Presentation: Vertex  General:  Alert, oriented and cooperative. Patient is in no acute distress.  Skin: Skin is warm and dry. No rash noted.   Cardiovascular: Normal heart rate noted  Respiratory: Normal respiratory effort, no problems with respiration noted  Abdomen: Soft, gravid, appropriate for gestational age.  Pain/Pressure: Present     Pelvic: Cervical exam performed Dilation: Fingertip Effacement (%): Thick Station: Ballotable  Extremities: Normal range of motion.  Edema: Trace  Mental Status: Normal mood and affect. Normal behavior. Normal judgment and thought content.   Assessment and Plan:  Pregnancy: G3P1011 at 413w0d  1. Supervision of other normal pregnancy, antepartum - Patient doing well, reports occasional mild contractions that occur 4-5 times per hour - Anticipatory guidance on next appointment and possibility of IOL if postdates  - Educated on EPO, RRT and IC to induce contractions  - Labor precautions and reasons to go  to MAU discussed   2. Unwanted fertility - Plans for PP BTL, consent signed 10/08/17  Term labor symptoms and general obstetric precautions including but not limited to vaginal bleeding, contractions, leaking of fluid and fetal movement were reviewed in detail with the patient. Please refer to After Visit Summary for other counseling recommendations.  Return in about 1 week (around 02/18/2018) for ROB.  Future Appointments  Date Time Provider Department Center  02/19/2018  9:45 AM Sharyon Cableogers, Xandria Gallaga C, CNM [facility] None    Sharyon CableVeronica C Fernando Stoiber, CNM

## 2018-02-19 ENCOUNTER — Ambulatory Visit (INDEPENDENT_AMBULATORY_CARE_PROVIDER_SITE_OTHER): Payer: Medicaid Other | Admitting: Certified Nurse Midwife

## 2018-02-19 ENCOUNTER — Telehealth (HOSPITAL_COMMUNITY): Payer: Self-pay | Admitting: *Deleted

## 2018-02-19 ENCOUNTER — Encounter (HOSPITAL_COMMUNITY): Payer: Self-pay | Admitting: *Deleted

## 2018-02-19 ENCOUNTER — Encounter: Payer: Self-pay | Admitting: Certified Nurse Midwife

## 2018-02-19 VITALS — BP 111/68 | HR 94 | Wt 224.0 lb

## 2018-02-19 DIAGNOSIS — Z3483 Encounter for supervision of other normal pregnancy, third trimester: Secondary | ICD-10-CM

## 2018-02-19 DIAGNOSIS — Z3480 Encounter for supervision of other normal pregnancy, unspecified trimester: Secondary | ICD-10-CM

## 2018-02-19 NOTE — Telephone Encounter (Signed)
Preadmission screen  

## 2018-02-19 NOTE — Patient Instructions (Signed)
Reasons to go to MAU:  1.  Contractions are  5 minutes apart or less, each last 1 minute, these have been going on for 1-2 hours, and you cannot walk or talk during them 2.  You have a large gush of fluid, or a trickle of fluid that will not stop and you have to wear a pad 3.  You have bleeding that is bright red, heavier than spotting--like menstrual bleeding (spotting can be normal in early labor or after a check of your cervix) 4.  You do not feel the baby moving like he/she normally does  

## 2018-02-19 NOTE — Progress Notes (Signed)
   PRENATAL VISIT NOTE  Subjective:  Selena Malone is a 27 y.o. G3P1011 at 4550w1d being seen today for ongoing prenatal care.  She is currently monitored for the following issues for this low-risk pregnancy and has Supervision of normal pregnancy, antepartum; Low blood hemoglobin A2; Low lying placenta, antepartum; Vestibular dizziness; and Unwanted fertility on their problem list.  Patient reports occasional contractions.  Contractions: Irregular. Vag. Bleeding: None.  Movement: Present. Denies leaking of fluid.   The following portions of the patient's history were reviewed and updated as appropriate: allergies, current medications, past family history, past medical history, past social history, past surgical history and problem list. Problem list updated.  Objective:   Vitals:   02/19/18 0946  BP: 111/68  Pulse: 94  Weight: 224 lb (101.6 kg)    Fetal Status: Fetal Heart Rate (bpm): 150   Movement: Present  Presentation: Vertex  General:  Alert, oriented and cooperative. Patient is in no acute distress.  Skin: Skin is warm and dry. No rash noted.   Cardiovascular: Normal heart rate noted  Respiratory: Normal respiratory effort, no problems with respiration noted  Abdomen: Soft, gravid, appropriate for gestational age.  Pain/Pressure: Present     Pelvic: Cervical exam performed Dilation: 1 Effacement (%): Thick Station: -3  Extremities: Normal range of motion.     Mental Status: Normal mood and affect. Normal behavior. Normal judgment and thought content.   Assessment and Plan:  Pregnancy: G3P1011 at 3150w1d  1. Supervision of other normal pregnancy, antepartum - Patient doing okay, "over being pregnant, wants to be induced whenever possible" - Anticipatory guidance on upcoming appointments, discussed IOL due to postdates next week  - Discussed continued use of EPO and RRT  - OP FB on 12/26 and IOL on 12/27 scheduled, orders placed  - Labor precautions given   Term labor  symptoms and general obstetric precautions including but not limited to vaginal bleeding, contractions, leaking of fluid and fetal movement were reviewed in detail with the patient. Please refer to After Visit Summary for other counseling recommendations.  Return in about 1 week (around 02/26/2018) for ROB.  Future Appointments  Date Time Provider Department Center  02/26/2018  1:00 PM Sharyon CableRogers, Jodie Leiner C, CNM [facility] None  02/27/2018  7:00 AM [facility] None    Sharyon CableVeronica C Magnus Crescenzo, CNM

## 2018-02-25 ENCOUNTER — Inpatient Hospital Stay: Admission: RE | Admit: 2018-02-25 | Payer: Medicaid Other | Source: Home / Self Care

## 2018-02-26 ENCOUNTER — Inpatient Hospital Stay (HOSPITAL_COMMUNITY)
Admission: AD | Admit: 2018-02-26 | Discharge: 2018-02-26 | Disposition: A | Discharge status: Home / Self Care | Payer: Medicaid Other | Source: Ambulatory Visit | Attending: Obstetrics & Gynecology | Admitting: Obstetrics & Gynecology

## 2018-02-26 ENCOUNTER — Encounter (HOSPITAL_COMMUNITY): Payer: Self-pay

## 2018-02-26 ENCOUNTER — Encounter: Payer: Self-pay | Admitting: Certified Nurse Midwife

## 2018-02-26 ENCOUNTER — Other Ambulatory Visit: Payer: Self-pay

## 2018-02-26 ENCOUNTER — Ambulatory Visit (INDEPENDENT_AMBULATORY_CARE_PROVIDER_SITE_OTHER): Payer: Medicaid Other | Admitting: Certified Nurse Midwife

## 2018-02-26 VITALS — BP 109/73 | HR 101 | Wt 225.2 lb

## 2018-02-26 DIAGNOSIS — O471 False labor at or after 37 completed weeks of gestation: Secondary | ICD-10-CM | POA: Insufficient documentation

## 2018-02-26 DIAGNOSIS — Z3480 Encounter for supervision of other normal pregnancy, unspecified trimester: Secondary | ICD-10-CM

## 2018-02-26 DIAGNOSIS — Z3A40 40 weeks gestation of pregnancy: Secondary | ICD-10-CM | POA: Insufficient documentation

## 2018-02-26 DIAGNOSIS — Z3483 Encounter for supervision of other normal pregnancy, third trimester: Secondary | ICD-10-CM

## 2018-02-26 NOTE — Patient Instructions (Addendum)
OUTPATIENT FOLEY BULB INDUCTION OF LABOR:  Information Sheet for Mothers and Family               What's a Foley Bulb Induction? A Foley bulb induction is a procedure where your provider inserts a catheter into your cervix. Once inside your womb, your provider inflates the balloon with a saline solution.   This puts pressure on your cervix and encourages dilation. The catheter falls out once your cervix dilates to 3-4 centimeters.     With any procedure, it's important that you know what to expect. The insertion of a Foley catheter can be a bit uncomfortable, and some women experience sharp pelvic pain. The pain may subside once the catheter is in place. You may experience some cramping when the Foley catheter is in place.  This is normal.     GO TO THE MATERNITY ADMISSIONS UNIT FOR THE FOLLOWING:  Heavy vaginal bleeding  Rupture of membranes (fluid that wets your underwear)  Painful uterine contractions every 5 minutes or less  Severe abdominal discomfort  Decreased movement of the baby    Labor Induction  Labor induction is when steps are taken to cause a pregnant woman to begin the labor process. Most women go into labor on their own between 37 weeks and 42 weeks of pregnancy. When this does not happen or when there is a medical need for labor to begin, steps may be taken to induce labor. Labor induction causes a pregnant woman's uterus to contract. It also causes the cervix to soften (ripen), open (dilate), and thin out (efface). Usually, labor is not induced before 39 weeks of pregnancy unless there is a medical reason to do so. Your health care provider will determine if labor induction is needed. Before inducing labor, your health care provider will consider a number of factors, including:  Your medical condition and your baby's.  How many weeks along you are in your pregnancy.  How mature your baby's lungs are.  The condition of your cervix.  The position of your  baby.  The size of your birth canal. What are some reasons for labor induction? Labor may be induced if:  Your health or your baby's health is at risk.  Your pregnancy is overdue by 1 week or more.  Your water breaks but labor does not start on its own.  There is a low amount of amniotic fluid around your baby. You may also choose (elect) to have labor induced at a certain time. Generally, elective labor induction is done no earlier than 39 weeks of pregnancy. What methods are used for labor induction? Methods used for labor induction include:  Prostaglandin medicine. This medicine starts contractions and causes the cervix to dilate and ripen. It can be taken by mouth (orally) or by being inserted into the vagina (suppository).  Inserting a small, thin tube (catheter) with a balloon into the vagina and then expanding the balloon with water to dilate the cervix.  Stripping the membranes. In this method, your health care provider gently separates amniotic sac tissue from the cervix. This causes the cervix to stretch, which in turn causes the release of a hormone called progesterone. The hormone causes the uterus to contract. This procedure is often done during an office visit, after which you will be sent home to wait for contractions to begin.  Breaking the water. In this method, your health care provider uses a small instrument to make a small hole in the amniotic sac. This eventually  causes the amniotic sac to break. Contractions should begin after a few hours.  Medicine to trigger or strengthen contractions. This medicine is given through an IV that is inserted into a vein in your arm. Except for membrane stripping, which can be done in a clinic, labor induction is done in the hospital so that you and your baby can be carefully monitored. How long does it take for labor to be induced? The length of time it takes to induce labor depends on how ready your body is for labor. Some  inductions can take up to 2-3 days, while others may take less than a day. Induction may take longer if:  You are induced early in your pregnancy.  It is your first pregnancy.  Your cervix is not ready. What are some risks associated with labor induction? Some risks associated with labor induction include:  Changes in fetal heart rate, such as being too high, too low, or irregular (erratic).  Failed induction.  Infection in the mother or the baby.  Increased risk of having a cesarean delivery.  Fetal death.  Breaking off (abruption) of the placenta from the uterus (rare).  Rupture of the uterus (very rare). When induction is needed for medical reasons, the benefits of induction generally outweigh the risks. What are some reasons for not inducing labor? Labor induction should not be done if:  Your baby does not tolerate contractions.  You have had previous surgeries on your uterus, such as a myomectomy, removal of fibroids, or a vertical scar from a previous cesarean delivery.  Your placenta lies very low in your uterus and blocks the opening of the cervix (placenta previa).  Your baby is not in a head-down position.  The umbilical cord drops down into the birth canal in front of the baby.  There are unusual circumstances, such as the baby being very early (premature).  You have had more than 2 previous cesarean deliveries. Summary  Labor induction is when steps are taken to cause a pregnant woman to begin the labor process.  Labor induction causes a pregnant woman's uterus to contract. It also causes the cervix to ripen, dilate, and efface.  Labor is not induced before 39 weeks of pregnancy unless there is a medical reason to do so.  When induction is needed for medical reasons, the benefits of induction generally outweigh the risks. This information is not intended to replace advice given to you by your health care provider. Make sure you discuss any questions you  have with your health care provider. Document Released: 07/10/2006 Document Revised: 04/03/2016 Document Reviewed: 04/03/2016 Elsevier Interactive Patient Education  2019 [employer]

## 2018-02-26 NOTE — MAU Note (Signed)
OB appointment today with foley bulb placement, scheduled for IOL tomorrow at 0700. Contractions started around 1730, Q394min. Reporting bloody discharge in foley bulb tube, +FM.

## 2018-02-26 NOTE — Progress Notes (Signed)
   PRENATAL VISIT NOTE  Subjective:  Selena Malone is a 27 y.o. G3P1011 at 2581w1d being seen today for ongoing prenatal care.  She is currently monitored for the following issues for this low-risk pregnancy and has Supervision of normal pregnancy, antepartum; Low blood hemoglobin A2; Low lying placenta, antepartum; Vestibular dizziness; and Unwanted fertility on their problem list.  Patient reports no complaints.  Contractions: Irregular. Vag. Bleeding: None.  Movement: Present. Denies leaking of fluid.   The following portions of the patient's history were reviewed and updated as appropriate: allergies, current medications, past family history, past medical history, past social history, past surgical history and problem list. Problem list updated.  Objective:   Vitals:   02/26/18 1308  BP: 109/73  Pulse: (!) 101  Weight: 225 lb 3.2 oz (102.2 kg)    Fetal Status: Fetal Heart Rate (bpm): NST Fundal Height: 36 cm Movement: Present  Presentation: Vertex  General:  Alert, oriented and cooperative. Patient is in no acute distress.  Skin: Skin is warm and dry. No rash noted.   Cardiovascular: Normal heart rate noted  Respiratory: Normal respiratory effort, no problems with respiration noted  Abdomen: Soft, gravid, appropriate for gestational age.  Pain/Pressure: Present     Pelvic: Cervical exam performed Dilation: 1 Effacement (%): Thick Station: -2  Extremities: Normal range of motion.  Edema: Trace  Mental Status:  Normal mood and affect. Normal behavior. Normal judgment and thought content.  Procedure: Patient informed of R/B/A of procedure. NST was performed and was reactive prior to procedure. NST:  EFM: Baseline: 130 bpm, Variability: Good {> 6 bpm), Accelerations: Reactive and Decelerations: Absent Toco: irregular, every 2-5 minutes Procedure done to begin ripening of the cervix prior to admission for induction of labor. Appropriate time out taken. The patient was placed  in the lithotomy position and a cervical exam was performed and a finger was used to guide the 4F foley balloon through the internal os of the cervix. Foley Balloon filled with 60cc of sterile water. Plug inserted into end of the foley. Foley placed on tension and taped to medial thigh.  NST:  EFM Baseline: 135 bpm, Variability: Good {> 6 bpm), Accelerations: Reactive and Decelerations: Absent Toco: regular, every 2-3 minutes, mild by palpation- patient talking through contractions  There were no signs of tachysystole or hypertonus. All equipment was removed and accounted for. The patient tolerated the procedure well.  Assessment and Plan:  Pregnancy: G3P1011 at 7681w1d 1. Supervision of other normal pregnancy, antepartum - OP foley bulb inserted, patient tolerated procedure well  - Precautions reviewed  - IOL on 12/27 - Fetal nonstress test  S/p Outpatient placement of foley balloon catheter for cervical ripening. Induction of labor scheduled for tomorrow at 0700 am. Reassuring FHR tracing with no concerns at present. Warning signs given to patient to include return to MAU for heavy vaginal bleeding, Rupture of membranes, painful uterine contractions q 5 mins or less, severe abdominal discomfort, decreased fetal movement.  Return in about 5 weeks (around 04/02/2018) for POSTPARTUM.   Sharyon CableVeronica C Jarrius Huaracha, CNM 02/26/2018 2:05 PM

## 2018-02-26 NOTE — Progress Notes (Signed)
I have communicated with Thressa ShellerHeather Hogan, CNM and reviewed vital signs:  Vitals:   02/26/18 2042 02/26/18 2138  BP: 100/73 115/62  Pulse: 92 100  Resp: 18 18  Temp: 98 F (36.7 C) 97.9 F (36.6 C)    Vaginal exam:  Dilation: 1(Foley bulb in place, unable to determine dilation) Exam by:: [facility]aitlin Wong Steadham, RN,   Also reviewed contraction pattern and that non-stress test is reactive.  It has been documented that patient is contracting every 2-4.5 minutes with no cervical change and foley bulb still in place, not indicating active labor.  Patient denies any other complaints.  Based on this report provider has given order for discharge.  A discharge order and diagnosis entered by a provider.   Labor discharge instructions reviewed with patient.

## 2018-02-26 NOTE — Discharge Instructions (Signed)

## 2018-02-26 NOTE — Progress Notes (Signed)
Pt is here for NST foley bulb placement 3472w1d.

## 2018-02-27 ENCOUNTER — Inpatient Hospital Stay (HOSPITAL_COMMUNITY): Payer: Medicaid Other | Admitting: Anesthesiology

## 2018-02-27 ENCOUNTER — Inpatient Hospital Stay (HOSPITAL_COMMUNITY)
Admission: AD | Admit: 2018-02-27 | Discharge: 2018-02-28 | DRG: 807 | Disposition: A | Discharge status: Home / Self Care | Payer: Medicaid Other | Attending: Obstetrics & Gynecology | Admitting: Obstetrics & Gynecology

## 2018-02-27 ENCOUNTER — Encounter (HOSPITAL_COMMUNITY)
Admission: AD | Disposition: A | Discharge status: Home / Self Care | Payer: Self-pay | Source: Home / Self Care | Attending: Obstetrics & Gynecology

## 2018-02-27 ENCOUNTER — Inpatient Hospital Stay (HOSPITAL_COMMUNITY): Admission: RE | Admit: 2018-02-27 | Payer: Medicaid Other | Source: Ambulatory Visit

## 2018-02-27 ENCOUNTER — Encounter (HOSPITAL_COMMUNITY): Payer: Self-pay

## 2018-02-27 DIAGNOSIS — Z87891 Personal history of nicotine dependence: Secondary | ICD-10-CM

## 2018-02-27 DIAGNOSIS — Z3A40 40 weeks gestation of pregnancy: Secondary | ICD-10-CM

## 2018-02-27 DIAGNOSIS — O480 Post-term pregnancy: Secondary | ICD-10-CM | POA: Diagnosis present

## 2018-02-27 LAB — CBC
HCT: 37.7 % (ref 36.0–46.0)
Hemoglobin: 12.4 g/dL (ref 12.0–15.0)
MCH: 32.6 pg (ref 26.0–34.0)
MCHC: 32.9 g/dL (ref 30.0–36.0)
MCV: 99.2 fL (ref 80.0–100.0)
Platelets: 135 10*3/uL — ABNORMAL LOW (ref 150–400)
RBC: 3.8 MIL/uL — ABNORMAL LOW (ref 3.87–5.11)
RDW: 13.3 % (ref 11.5–15.5)
WBC: 10.2 10*3/uL (ref 4.0–10.5)
nRBC: 0 % (ref 0.0–0.2)

## 2018-02-27 LAB — TYPE AND SCREEN
ABO/RH(D): O POS
Antibody Screen: NEGATIVE

## 2018-02-27 LAB — RPR: RPR Ser Ql: NONREACTIVE

## 2018-02-27 LAB — ABO/RH: ABO/RH(D): O POS

## 2018-02-27 SURGERY — LIGATION, FALLOPIAN TUBE, POSTPARTUM
Anesthesia: Choice

## 2018-02-27 MED ORDER — DIPHENHYDRAMINE HCL 50 MG/ML IJ SOLN: 12.5000 mg | INTRAMUSCULAR | Status: DC | PRN

## 2018-02-27 MED ORDER — EPHEDRINE 5 MG/ML INJ
10.0000 mg | INTRAVENOUS | Status: DC | PRN
  Filled 2018-02-27: qty 2

## 2018-02-27 MED ORDER — TETANUS-DIPHTH-ACELL PERTUSSIS 5-2.5-18.5 LF-MCG/0.5 IM SUSP: 0.5000 mL | Freq: Once | INTRAMUSCULAR | Status: DC

## 2018-02-27 MED ORDER — SIMETHICONE 80 MG PO CHEW: 80.0000 mg | CHEWABLE_TABLET | ORAL | Status: DC | PRN

## 2018-02-27 MED ORDER — LIDOCAINE HCL (PF) 1 % IJ SOLN
INTRAMUSCULAR | Status: DC | PRN
  Administered 2018-02-27 (×2): 4 mL via EPIDURAL

## 2018-02-27 MED ORDER — LIDOCAINE HCL (PF) 1 % IJ SOLN
30.0000 mL | INTRAMUSCULAR | Status: DC | PRN
  Filled 2018-02-27: qty 30

## 2018-02-27 MED ORDER — BENZOCAINE-MENTHOL 20-0.5 % EX AERO
1.0000 "application " | INHALATION_SPRAY | CUTANEOUS | Status: DC | PRN
  Filled 2018-02-27: qty 56

## 2018-02-27 MED ORDER — LACTATED RINGERS IV SOLN: 500.0000 mL | INTRAVENOUS | Status: DC | PRN

## 2018-02-27 MED ORDER — PHENYLEPHRINE 40 MCG/ML (10ML) SYRINGE FOR IV PUSH (FOR BLOOD PRESSURE SUPPORT)
80.0000 ug | PREFILLED_SYRINGE | INTRAVENOUS | Status: DC | PRN
  Filled 2018-02-27: qty 10

## 2018-02-27 MED ORDER — MISOPROSTOL 25 MCG QUARTER TABLET: 25.0000 ug | ORAL_TABLET | ORAL | Status: DC | PRN

## 2018-02-27 MED ORDER — PRENATAL MULTIVITAMIN CH
1.0000 | ORAL_TABLET | Freq: Every day | ORAL | Status: DC
  Administered 2018-02-28: 1 via ORAL
  Filled 2018-02-27: qty 1

## 2018-02-27 MED ORDER — ZOLPIDEM TARTRATE 5 MG PO TABS: 5.0000 mg | ORAL_TABLET | Freq: Every evening | ORAL | Status: DC | PRN

## 2018-02-27 MED ORDER — TERBUTALINE SULFATE 1 MG/ML IJ SOLN
0.2500 mg | Freq: Once | INTRAMUSCULAR | Status: DC | PRN
  Filled 2018-02-27: qty 1

## 2018-02-27 MED ORDER — LACTATED RINGERS IV SOLN: 500.0000 mL | Freq: Once | INTRAVENOUS | Status: DC

## 2018-02-27 MED ORDER — ACETAMINOPHEN 325 MG PO TABS: 650.0000 mg | ORAL_TABLET | ORAL | Status: DC | PRN

## 2018-02-27 MED ORDER — SOD CITRATE-CITRIC ACID 500-334 MG/5ML PO SOLN: 30.0000 mL | ORAL | Status: DC | PRN

## 2018-02-27 MED ORDER — FENTANYL 2.5 MCG/ML BUPIVACAINE 1/10 % EPIDURAL INFUSION (WH - ANES)
INTRAMUSCULAR | Status: AC
  Filled 2018-02-27: qty 100

## 2018-02-27 MED ORDER — LACTATED RINGERS IV SOLN
500.0000 mL | Freq: Once | INTRAVENOUS | Status: AC
  Administered 2018-02-27: 500 mL via INTRAVENOUS

## 2018-02-27 MED ORDER — FENTANYL 2.5 MCG/ML BUPIVACAINE 1/10 % EPIDURAL INFUSION (WH - ANES)
14.0000 mL/h | INTRAMUSCULAR | Status: DC | PRN
  Administered 2018-02-27 (×2): 14 mL/h via EPIDURAL
  Filled 2018-02-27: qty 100

## 2018-02-27 MED ORDER — LACTATED RINGERS IV SOLN
INTRAVENOUS | Status: DC
  Administered 2018-02-27: 02:00:00 via INTRAVENOUS

## 2018-02-27 MED ORDER — ONDANSETRON HCL 4 MG PO TABS: 4.0000 mg | ORAL_TABLET | ORAL | Status: DC | PRN

## 2018-02-27 MED ORDER — DIBUCAINE 1 % RE OINT: 1.0000 "application " | TOPICAL_OINTMENT | RECTAL | Status: DC | PRN

## 2018-02-27 MED ORDER — OXYTOCIN 40 UNITS IN LACTATED RINGERS INFUSION - SIMPLE MED
1.0000 m[IU]/min | INTRAVENOUS | Status: DC
  Administered 2018-02-27: 12 m[IU]/min via INTRAVENOUS
  Administered 2018-02-27: 2 m[IU]/min via INTRAVENOUS
  Filled 2018-02-27: qty 1000

## 2018-02-27 MED ORDER — OXYTOCIN 40 UNITS IN LACTATED RINGERS INFUSION - SIMPLE MED: 2.5000 [IU]/h | INTRAVENOUS | Status: DC

## 2018-02-27 MED ORDER — OXYTOCIN BOLUS FROM INFUSION
500.0000 mL | Freq: Once | INTRAVENOUS | Status: AC
  Administered 2018-02-27: 500 mL via INTRAVENOUS

## 2018-02-27 MED ORDER — PHENYLEPHRINE 40 MCG/ML (10ML) SYRINGE FOR IV PUSH (FOR BLOOD PRESSURE SUPPORT)
PREFILLED_SYRINGE | INTRAVENOUS | Status: AC
  Filled 2018-02-27: qty 10

## 2018-02-27 MED ORDER — ONDANSETRON HCL 4 MG/2ML IJ SOLN: 4.0000 mg | INTRAMUSCULAR | Status: DC | PRN

## 2018-02-27 MED ORDER — ONDANSETRON HCL 4 MG/2ML IJ SOLN: 4.0000 mg | Freq: Four times a day (QID) | INTRAMUSCULAR | Status: DC | PRN

## 2018-02-27 MED ORDER — WITCH HAZEL-GLYCERIN EX PADS: 1.0000 "application " | MEDICATED_PAD | CUTANEOUS | Status: DC | PRN

## 2018-02-27 MED ORDER — COCONUT OIL OIL: 1.0000 "application " | TOPICAL_OIL | Status: DC | PRN

## 2018-02-27 MED ORDER — SENNOSIDES-DOCUSATE SODIUM 8.6-50 MG PO TABS
2.0000 | ORAL_TABLET | ORAL | Status: DC
  Administered 2018-02-27: 2 via ORAL
  Filled 2018-02-27: qty 2

## 2018-02-27 MED ORDER — DIPHENHYDRAMINE HCL 25 MG PO CAPS: 25.0000 mg | ORAL_CAPSULE | Freq: Four times a day (QID) | ORAL | Status: DC | PRN

## 2018-02-27 MED ORDER — IBUPROFEN 600 MG PO TABS
600.0000 mg | ORAL_TABLET | Freq: Four times a day (QID) | ORAL | Status: DC
  Administered 2018-02-27 – 2018-02-28 (×4): 600 mg via ORAL
  Filled 2018-02-27 (×4): qty 1

## 2018-02-27 NOTE — H&P (Signed)
Ellah Rampy is a 27 y.o. female G3P1011 with IUP at 2156w2d presenting for elective IOL.Outpt foley placed in MAU, came outaround midnight.  Ctx are stronger and closer together now.    Prenatal History/Complications: Term SVD w/o problems    Past Medical History: Past Medical History:  Diagnosis Date  . Iron deficiency anemia     Past Surgical History: Past Surgical History:  Procedure Laterality Date  . TONSILLECTOMY      Obstetrical History: OB History    Gravida  3   Para  1   Term  1   Preterm  0   AB  1   Living  1     SAB  0   TAB  1   Ectopic  0   Multiple      Live Births  1           Social History: Social History   Socioeconomic History  . Marital status: Married    Spouse name: Not on file  . Number of children: Not on file  . Years of education: Not on file  . Highest education level: Not on file  Occupational History  . Not on file  Social Needs  . Financial resource strain: Not hard at all  . Food insecurity:    Worry: Never true    Inability: Never true  . Transportation needs:    Medical: No    Non-medical: Not on file  Tobacco Use  . Smoking status: Former Games developermoker  . Smokeless tobacco: Never Used  Substance and Sexual Activity  . Alcohol use: Not Currently  . Drug use: Not Currently  . Sexual activity: Yes    Partners: Male  Lifestyle  . Physical activity:    Days per week: Not on file    Minutes per session: Not on file  . Stress: Only a little  Relationships  . Social connections:    Talks on phone: Not on file    Gets together: Not on file    Attends religious service: Not on file    Active member of club or organization: Not on file    Attends meetings of clubs or organizations: Not on file    Relationship status: Not on file  Other Topics Concern  . Not on file  Social History Narrative   ** Merged History Encounter **        Family History: Family History  Problem Relation Age of Onset  .  Graves' disease Mother     Allergies: Allergies  Allergen Reactions  . Codeine Itching    Medications Prior to Admission  Medication Sig Dispense Refill Last Dose  . butalbital-acetaminophen-caffeine (FIORICET, ESGIC) 50-325-40 MG tablet Take 1-2 tablets by mouth every 6 (six) hours as needed. 45 tablet 4 02/25/2018 at Unknown time  . docusate sodium (COLACE) 100 MG capsule Take 100 mg by mouth 2 (two) times daily.   Taking  . ferrous sulfate 325 (65 FE) MG EC tablet Take 325 mg by mouth 3 (three) times daily with meals.   Taking  . meclizine (ANTIVERT) 25 MG tablet Take 1 tablet (25 mg total) by mouth 3 (three) times daily as needed for dizziness. 30 tablet 0 Taking  . ondansetron (ZOFRAN ODT) 4 MG disintegrating tablet Take 1 tablet (4 mg total) by mouth every 8 (eight) hours as needed for nausea or vomiting. 15 tablet 0 Taking  . Prenatal-DSS-FeCb-FeGl-FA (CITRANATAL BLOOM) 90-1 MG TABS Take 1 tablet by mouth daily. 30 tablet  12 02/25/2018 at Unknown time        Review of Systems   Constitutional: Negative for fever and chills Eyes: Negative for visual disturbances Respiratory: Negative for shortness of breath, dyspnea Cardiovascular: Negative for chest pain or palpitations  Gastrointestinal: Negative for abdominal pain, vomiting, diarrhea and constipation.   Genitourinary: Negative for dysuria and urgency Musculoskeletal: Negative for back pain, joint pain, myalgias  Neurological: Negative for dizziness and headaches      Last menstrual period 05/21/2017. General appearance: alert, cooperative and no distress Lungs: clear to auscultation bilaterally Heart: regular rate and rhythm Abdomen: soft, non-tender; bowel sounds normal Extremities: Homans sign is negative, no sign of DVT DTR's 2+ Presentation: cephalic Fetal monitoring  Baseline: 140 bpm, Variability: Good {> 6 bpm), Accelerations: Reactive and Decelerations: Absent Uterine activity  2-5 minutes  Dilation:  3.5 Effacement (%): 40 Station: -3 Exam by:: Latricia HeftAnna Cioce, RN   Prenatal labs: ABO, Rh: O/Positive/-- (06/05 1054) Antibody: Negative (06/05 1054) Rubella: immune RPR: Non Reactive (10/02 1020)  HBsAg: Negative (06/05 1054)  HIV: Non Reactive (10/02 1020)  GBS: Negative (11/27 1421)    Prenatal Transfer Tool  Maternal Diabetes: No Genetic Screening: Normal Maternal Ultrasounds/Referrals: Normal Fetal Ultrasounds or other Referrals:  None Maternal Substance Abuse:  No Significant Maternal Medications:  None Significant Maternal Lab Results: None   Nursing Staff Provider  Office Location  [facility] Dating  LMP   Language  english Anatomy US  normal  Flu Vaccine  12/03/17 Genetic Screen  NIPS: low risk-female  AFP:  neg   TDaP vaccine   12/03/17 Hgb A1C or  GTT Early A1C: neg Third trimester: 81-125-107  Rhogam  N/a (O pos)   LAB RESULTS   Feeding Plan  bottle Blood Type O/Positive/-- (06/05 1054)   Contraception BTL Antibody Negative (06/05 1054)  Circumcision N/a female Rubella 1.75 (06/05 1054)  Pediatrician   [facility] RPR Non Reactive (10/02 1020)   Support Person  Viviann SpareSteven (FOB) HBsAg Negative (06/05 1054)   Prenatal Classes  HIV Non Reactive (10/02 1020)  BTL Consent  10/08/2017 GBS  neg  VBAC Consent  Pap 08/06/17: negative    Hgb Electro  Low A2    CF Neg    SMA/Fragile X Neg    Waterbirth  [ ]  Class [ ]  Consent [ ]  CNM visit   No results found for this or any previous visit (from the past 24 hour(s)).  Assessment: Elmarie Shileyiffany Ziebarth is a 27 y.o. G3P1011 with an IUP at 5872w2d presenting for elective IOL.  Plan: #Labor: pitocin if needed #Pain:  Per request #FWB Cat 1   Jacklyn ShellFrances Cresenzo-Dishmon 02/27/2018, 1:19 AM

## 2018-02-27 NOTE — MAU Note (Signed)
Foley bulb fell out an hour ago.  Contractions have gotten stronger/closer.  No LOF.  Some VB.  + FM.

## 2018-02-27 NOTE — Progress Notes (Signed)
LABOR PROGRESS NOTE  Selena Malone is a 27 y.o. G3P1011 at 4046w2d  admitted for elective IOL.  Subjective: Patient doing well, comfortable with epidural   Objective: BP 109/65   Pulse 93   Temp 98.3 F (36.8 C) (Oral)   Resp 16   LMP 05/21/2017 (Exact Date)   SpO2 98%  or  Vitals:   02/27/18 0801 02/27/18 0831 02/27/18 0902 02/27/18 0931  BP: 110/77 113/62 (!) 94/44 109/65  Pulse: 99 100 98 93  Resp: 16     Temp:      TempSrc:      SpO2:        AROM- bloody @ 0922, currently on 3510milli-unit/min of Pitocin  Dilation: 5 Effacement (%): 60 Station: -2 Presentation: Vertex Exam by:: Aundria Rudogers, CNM FHT: baseline rate 140, moderate varibility, +accel, variable and early decel Toco: 2-5/ moderate by palpation   Labs: Lab Results  Component Value Date   WBC 10.2 02/27/2018   HGB 12.4 02/27/2018   HCT 37.7 02/27/2018   MCV 99.2 02/27/2018   PLT 135 (L) 02/27/2018    Patient Active Problem List   Diagnosis Date Noted  . Post-dates pregnancy 02/27/2018  . Unwanted fertility 01/28/2018  . Vestibular dizziness 01/01/2018  . Low lying placenta, antepartum 11/05/2017  . Low blood hemoglobin A2 08/11/2017  . Supervision of normal pregnancy, antepartum 08/05/2017    Assessment / Plan: 27 y.o. G3P1011 at 6846w2d here for Elective IOL for postdates   Labor: AROM @0922 , continue pitocin titration to active labor  Fetal Wellbeing:  Cat II  Pain Control:  Epidural  Anticipated MOD:  SVD   Sharyon CableRogers, Jahmel Flannagan C, CNM 02/27/2018, 9:44 AM

## 2018-02-27 NOTE — Anesthesia Procedure Notes (Signed)
Epidural Patient location during procedure: OB Start time: 02/27/2018 3:40 AM End time: 02/27/2018 3:43 AM  Staffing Anesthesiologist: Kaylyn LayerHowze, Sundae Maners E, MD Performed: anesthesiologist   Preanesthetic Checklist Completed: patient identified, pre-op evaluation, timeout performed, IV checked, risks and benefits discussed and monitors and equipment checked  Epidural Patient position: sitting Prep: site prepped and draped and DuraPrep Patient monitoring: continuous pulse ox, blood pressure, heart rate and cardiac monitor Approach: midline Location: L3-L4 Injection technique: LOR air  Needle:  Needle type: Tuohy  Needle gauge: 17 G Needle length: 9 cm Needle insertion depth: 7 cm Catheter type: closed end flexible Catheter size: 19 Gauge Catheter at skin depth: 12 cm Test dose: negative and Other (1% lidocaine)  Assessment Events: blood not aspirated, injection not painful, no injection resistance, negative IV test and no paresthesia  Additional Notes Patient identified. Risks, benefits, and alternatives discussed with patient including but not limited to bleeding, infection, nerve damage, paralysis, failed block, incomplete pain control, headache, blood pressure changes, nausea, vomiting, reactions to medication, itching, and postpartum back pain. Confirmed with bedside nurse the patient's most recent platelet count. Confirmed with patient that they are not currently taking any anticoagulation, have any bleeding history, or any family history of bleeding disorders. Patient expressed understanding and wished to proceed. All questions were answered. Sterile technique was used throughout the entire procedure. Crisp LOR on first pass. Please see nursing notes for vital signs. Test dose was given through epidural catheter and negative prior to continuing to dose epidural or start infusion. Warning signs of high block given to the patient including shortness of breath, tingling/numbness in  hands, complete motor block, or any concerning symptoms with instructions to call for help. Patient was given instructions on fall risk and not to get out of bed. All questions and concerns addressed with instructions to call with any issues or inadequate analgesia.  Reason for block:procedure for pain

## 2018-02-27 NOTE — Progress Notes (Signed)
LABOR PROGRESS NOTE  Selena Malone is a 27 y.o. G3P1011 at 5915w2d  admitted for elective IOL.   Subjective: Patient feeling increased pressure in bottom   Objective: BP 116/61   Pulse (!) 111   Temp 98.7 F (37.1 C) (Axillary)   Resp 18   LMP 05/21/2017 (Exact Date)   SpO2 98%  or  Vitals:   02/27/18 1101 02/27/18 1131 02/27/18 1201 02/27/18 1230  BP: 124/71 104/69 (!) 93/56 116/61  Pulse: (!) 108 94 98 (!) 111  Resp:  18 18   Temp:      TempSrc:      SpO2:        Large clot passed prior to cervical examination  Dilation: Lip/rim Effacement (%): 100 Station: Plus 1, Plus 2 Presentation: Vertex Exam by:: Lanice ShirtsV Mikiala Fugett CNM FHT: baseline rate 135, moderate varibility, +accel, variable decel Toco: 2-3/ moderate by palpation   Labs: Lab Results  Component Value Date   WBC 10.2 02/27/2018   HGB 12.4 02/27/2018   HCT 37.7 02/27/2018   MCV 99.2 02/27/2018   PLT 135 (L) 02/27/2018    Patient Active Problem List   Diagnosis Date Noted  . Post-dates pregnancy 02/27/2018  . Unwanted fertility 01/28/2018  . Vestibular dizziness 01/01/2018  . Low lying placenta, antepartum 11/05/2017  . Low blood hemoglobin A2 08/11/2017  . Supervision of normal pregnancy, antepartum 08/05/2017    Assessment / Plan: 27 y.o. G3P1011 at 4215w2d here for elective IOL   Labor: Expectant management, progressing well  Fetal Wellbeing:  Cat II  Pain Control:  Epidural  Anticipated MOD:  SVD   Sharyon CableRogers, Alois Mincer C, CNM 02/27/2018, 12:38 PM

## 2018-02-27 NOTE — Discharge Summary (Addendum)
Postpartum Discharge Summary     Patient Name: Selena Malone DOB: 04/30/1990 MRN: [id number]  Date of admission: 02/27/2018 DeliveElmarie Malone Provider: Sharyon CableOGERS, Selena C   Date of discharge: 02/28/2018  Admitting diagnosis: 40wks ctx Intrauterine pregnancy: 4080w2d     Secondary diagnosis:  Active Problems:   Post-dates pregnancy   SVD (spontaneous vaginal delivery)  Additional problems: None     Discharge diagnosis: Term Pregnancy Delivered                                                                                                Post partum procedures:none  Augmentation: AROM, Pitocin and Foley Balloon  Complications: None  Hospital course:  Induction of Labor With Vaginal Delivery   27 y.o. yo G3P1011 at 980w2d was admitted to the hospital 02/27/2018 for induction of labor.  Indication for induction: Postdates.  Patient had an uncomplicated labor course as follows: Membrane Rupture Time/Date: 9:22 AM ,02/27/2018   Intrapartum Procedures: Episiotomy: None [1]                                         Lacerations:  None [1]  Patient had delivery of a Viable infant.  Information for the patient's newborn:  Selena Malone, Selena Malone [[id number]][[id number]]  Delivery Method: Vaginal, Spontaneous(Filed from Delivery Summary)   02/27/2018  Details of delivery can be found in separate delivery note.  Patient had a routine postpartum course. Patient is discharged home 02/28/18.  Magnesium Sulfate recieved: No BMZ received: No  Physical exam  Vitals:   02/27/18 1550 02/27/18 1956 02/28/18 0000 02/28/18 0400  BP: 107/73 112/80 110/72 99/61  Pulse: (!) 113 88 85 88  Resp: 16 18 16 18   Temp: 98.9 F (37.2 C) 98.7 F (37.1 C) 98.6 F (37 C) 98.4 F (36.9 C)  TempSrc: Oral Oral Oral Oral  SpO2: 98%      General: alert, cooperative and no distress Lochia: appropriate Uterine Fundus: firm Incision: N/A DVT Evaluation: No evidence of DVT seen on physical exam. Labs: Lab  Results  Component Value Date   WBC 10.2 02/27/2018   HGB 12.4 02/27/2018   HCT 37.7 02/27/2018   MCV 99.2 02/27/2018   PLT 135 (L) 02/27/2018   CMP Latest Ref Rng & Units 12/31/2017  Glucose 70 - 99 mg/dL 99  BUN 6 - 20 mg/dL 9  Creatinine 7.820.44 - 9.561.00 mg/dL 2.130.65  Sodium 086135 - 578145 mmol/L 137  Potassium 3.5 - 5.1 mmol/L 4.2  Chloride 98 - 111 mmol/L 107  CO2 22 - 32 mmol/L 22  Calcium 8.9 - 10.3 mg/dL 8.2(L)    Discharge instruction: per After Visit Summary and "Baby and Me Booklet".  After visit meds:  Allergies as of 02/28/2018      Reactions   Codeine Itching      Medication List    STOP taking these medications   butalbital-acetaminophen-caffeine 50-325-40 MG tablet Commonly known as:  FIORICET, ESGIC   meclizine 25 MG tablet Commonly known as:  [employer]ERT  ondansetron 4 MG disintegrating tablet Commonly known as:  ZOFRAN ODT     TAKE these medications   acetaminophen 325 MG tablet Commonly known as:  TYLENOL Take 2 tablets (650 mg total) by mouth every 4 (four) hours as needed for mild pain or moderate pain.   CITRANATAL BLOOM 90-1 MG Tabs Take 1 tablet by mouth daily.   ibuprofen 600 MG tablet Commonly known as:  ADVIL,MOTRIN Take 1 tablet (600 mg total) by mouth every 6 (six) hours as needed for moderate pain or cramping.   senna-docusate 8.6-50 MG tablet Commonly known as:  Senokot-S Take 2 tablets by mouth daily. Start taking on:  March 01, 2018   simethicone 80 MG chewable tablet Commonly known as:  MYLICON Chew 1 tablet (80 mg total) by mouth as needed for flatulence.       Diet: routine diet  Activity: Advance as tolerated. Pelvic rest for 6 weeks.   Outpatient follow up:4 weeks Follow up Appt: Future Appointments  Date Time Provider Department Center  04/03/2018  9:00 AM Brock BadHarper, Charles A, MD [facility] None   Follow up Visit:   Please schedule this patient for Postpartum visit in: 4 weeks with the following provider: APP For C/S  patients schedule nurse incision check in weeks 2 weeks: no Low risk pregnancy complicated by: nothing Delivery mode:  SVD Anticipated Birth Control:  IUD PP Procedures needed: IUD insertion  Schedule Integrated BH visit: no   Newborn Data: Live born female  Birth Weight:   APGAR: 8, 9  Newborn Delivery   Birth date/time:  02/27/2018 13:17:00 Delivery type:  Vaginal, Spontaneous     Baby Feeding: Bottle Disposition:home with mother   02/28/2018 Selena NewcomerBridgid H Wilson, MD

## 2018-02-27 NOTE — Anesthesia Preprocedure Evaluation (Signed)
Anesthesia Evaluation    Reviewed: Allergy & Precautions, Patient's Chart, lab work & pertinent test results  Airway Mallampati: II  TM Distance: >3 FB Neck ROM: Full    Dental  (+) Teeth Intact, Dental Advisory Given   Pulmonary former smoker,    Pulmonary exam normal breath sounds clear to auscultation       Cardiovascular negative cardio ROS Normal cardiovascular exam Rhythm:Regular Rate:Normal     Neuro/Psych negative neurological ROS     GI/Hepatic negative GI ROS, Neg liver ROS,   Endo/Other  negative endocrine ROS  Renal/GU negative Renal ROS     Musculoskeletal negative musculoskeletal ROS (+)   Abdominal   Peds  Hematology negative hematology ROS (+)   Anesthesia Other Findings Day of surgery medications reviewed with the patient.  Reproductive/Obstetrics (+) Pregnancy                             Anesthesia Physical Anesthesia Plan  ASA: II  Anesthesia Plan: Epidural   Post-op Pain Management:    Induction:   PONV Risk Score and Plan: 2 and Treatment may vary due to age or medical condition  Airway Management Planned: Natural Airway  Additional Equipment:   Intra-op Plan:   Post-operative Plan:   Informed Consent: I have reviewed the patients History and Physical, chart, labs and discussed the procedure including the risks, benefits and alternatives for the proposed anesthesia with the patient or authorized representative who has indicated his/her understanding and acceptance.     Plan Discussed with: CRNA  Anesthesia Plan Comments:         Anesthesia Quick Evaluation

## 2018-02-28 MED ORDER — SIMETHICONE 80 MG PO CHEW: 80.0000 mg | CHEWABLE_TABLET | ORAL | 0 refills | Status: DC | PRN

## 2018-02-28 MED ORDER — SENNOSIDES-DOCUSATE SODIUM 8.6-50 MG PO TABS: 2.0000 | ORAL_TABLET | ORAL | 0 refills | Status: DC

## 2018-02-28 MED ORDER — ACETAMINOPHEN 325 MG PO TABS: 650.0000 mg | ORAL_TABLET | ORAL | 0 refills | Status: DC | PRN

## 2018-02-28 MED ORDER — IBUPROFEN 600 MG PO TABS: 600.0000 mg | ORAL_TABLET | Freq: Four times a day (QID) | ORAL | 0 refills | Status: DC | PRN

## 2018-02-28 NOTE — Anesthesia Postprocedure Evaluation (Signed)
Anesthesia Post Note  Patient: Selena Malone  Procedure(s) Performed: AN AD HOC LABOR EPIDURAL     Patient location during evaluation: Mother Baby Anesthesia Type: Epidural Level of consciousness: awake and alert, oriented and patient cooperative Pain management: pain level controlled Vital Signs Assessment: post-procedure vital signs reviewed and stable Respiratory status: spontaneous breathing Cardiovascular status: stable Postop Assessment: no headache, epidural receding, patient able to bend at knees and no signs of nausea or vomiting Anesthetic complications: no Comments: Pain score 0.    Last Vitals:  Vitals:   02/28/18 0000 02/28/18 0400  BP: 110/72 99/61  Pulse: 85 88  Resp: 16 18  Temp: 37 C 36.9 C  SpO2:      Last Pain:  Vitals:   02/28/18 0400  TempSrc: Oral  PainSc: 0-No pain   Pain Goal:                 [facility] HospitalWRINKLE,[name]

## 2018-04-02 ENCOUNTER — Ambulatory Visit (INDEPENDENT_AMBULATORY_CARE_PROVIDER_SITE_OTHER): Payer: Medicaid Other | Admitting: Certified Nurse Midwife

## 2018-04-02 ENCOUNTER — Encounter: Payer: Self-pay | Admitting: Certified Nurse Midwife

## 2018-04-02 ENCOUNTER — Other Ambulatory Visit: Payer: Self-pay

## 2018-04-02 VITALS — BP 110/62 | HR 82 | Ht 63.0 in | Wt 215.0 lb

## 2018-04-02 DIAGNOSIS — Z3202 Encounter for pregnancy test, result negative: Secondary | ICD-10-CM | POA: Diagnosis not present

## 2018-04-02 DIAGNOSIS — Z1389 Encounter for screening for other disorder: Secondary | ICD-10-CM

## 2018-04-02 DIAGNOSIS — Z30430 Encounter for insertion of intrauterine contraceptive device: Secondary | ICD-10-CM | POA: Diagnosis not present

## 2018-04-02 DIAGNOSIS — Z30014 Encounter for initial prescription of intrauterine contraceptive device: Secondary | ICD-10-CM

## 2018-04-02 LAB — POCT URINE PREGNANCY: Preg Test, Ur: NEGATIVE

## 2018-04-02 MED ORDER — LEVONORGESTREL 20 MCG/24HR IU IUD
INTRAUTERINE_SYSTEM | Freq: Once | INTRAUTERINE | Status: AC
  Administered 2018-04-02: 15:00:00 via INTRAUTERINE

## 2018-04-02 NOTE — Patient Instructions (Signed)

## 2018-04-02 NOTE — Progress Notes (Signed)
Subjective:     Selena Malone is a 28 y.o. female who presents for a postpartum visit. She is 4 weeks postpartum following a spontaneous vaginal delivery. I have fully reviewed the prenatal and intrapartum course. The delivery was at 40.0 gestational weeks. Outcome: spontaneous vaginal delivery. Anesthesia: epidural. Postpartum course has been Unremarkable. Baby's course has been Unremarkable Baby is feeding by bottle Rush Barer. Bleeding no bleeding. Bowel function is normal. Bladder function is normal. Patient is not sexually active. Contraception method is none. Postpartum depression screening: negative.  The following portions of the patient's history were reviewed and updated as appropriate: allergies, current medications, past family history and problem list.  Review of Systems A comprehensive review of systems was negative.   Objective:    BP 110/62   Pulse 82   Ht 5\' 3"  (1.6 m)   Wt 215 lb (97.5 kg)   LMP 05/21/2017 (Exact Date)   Breastfeeding No   BMI 38.09 kg/m   General:  alert, cooperative and no distress   Breasts:  inspection negative, no nipple discharge or bleeding, no masses or nodularity palpable  Lungs: clear to auscultation bilaterally  Heart:  regular rate and rhythm and S1, S2 normal  Abdomen: soft, non-tender; bowel sounds normal; no masses,  no organomegaly   Vulva:  normal  Vagina: normal vagina, no discharge, exudate, lesion, or erythema  Cervix:  retroverted  Corpus: normal size, contour, position, consistency, mobility, non-tender  Adnexa:  normal adnexa  Rectal Exam: Not performed.        IUD Insertion Procedure Note Patient identified, informed consent performed.  Discussed risks of irregular bleeding, cramping, infection, malpositioning or misplacement of the IUD outside the uterus which may require further procedure such as laparoscopy. Time out was performed.  Urine pregnancy test negative.  Speculum placed in the vagina.  Cervix visualized.   Cleaned with Betadine x 2.  Uterus sounded to 8 cm.  Mirena IUD placed per manufacturer's recommendations.  Strings trimmed to 3 cm. good hemostasis noted.  Patient tolerated procedure well.   Patient was given post-procedure instructions.  She was advised to be have backup contraception for one week.  Patient was also asked to check IUD strings periodically and follow up in 4 weeks for IUD check. Assessment/Plan:  1. Postpartum care and examination  -Normal postpartum exam. Pap smear not done at today's visit.   2. Encounter for initial prescription of intrauterine contraceptive device (IUD) - POCT urine pregnancy - levonorgestrel (MIRENA) 20 MCG/24HR IUD   Contraception: IUD Follow up in: 4 weeks for string check or as needed.    Sharyon Cable, CNM 04/02/18, 3:50 PM

## 2018-04-03 ENCOUNTER — Encounter: Payer: Self-pay | Admitting: Medical

## 2018-04-03 ENCOUNTER — Ambulatory Visit (INDEPENDENT_AMBULATORY_CARE_PROVIDER_SITE_OTHER): Payer: Medicaid Other | Admitting: Medical

## 2018-04-03 ENCOUNTER — Ambulatory Visit: Payer: Medicaid Other | Admitting: Obstetrics

## 2018-04-03 VITALS — BP 115/69 | HR 84 | Ht 63.0 in | Wt 215.0 lb

## 2018-04-03 DIAGNOSIS — Z30430 Encounter for insertion of intrauterine contraceptive device: Secondary | ICD-10-CM | POA: Diagnosis not present

## 2018-04-03 DIAGNOSIS — Z30432 Encounter for removal of intrauterine contraceptive device: Secondary | ICD-10-CM

## 2018-04-03 NOTE — Patient Instructions (Signed)
Intrauterine Device Insertion, Care After    This sheet gives you information about how to care for yourself after your procedure. Your health care provider may also give you more specific instructions. If you have problems or questions, contact your health care provider.  What can I expect after the procedure?  After the procedure, it is common to have:  · Cramps and pain in the abdomen.  · Light bleeding (spotting) or heavier bleeding that is like your menstrual period. This may last for up to a few days.  · Lower back pain.  · Dizziness.  · Headaches.  · Nausea.  Follow these instructions at home:  · Before resuming sexual activity, check to make sure that you can feel the IUD string(s). You should be able to feel the end of the string(s) below the opening of your cervix. If your IUD string is in place, you may resume sexual activity.  ? If you had a hormonal IUD inserted more than 7 days after your most recent period started, you will need to use a backup method of birth control for 7 days after IUD insertion. Ask your health care provider whether this applies to you.  · Continue to check that the IUD is still in place by feeling for the string(s) after every menstrual period, or once a month.  · Take over-the-counter and prescription medicines only as told by your health care provider.  · Do not drive or use heavy machinery while taking prescription pain medicine.  · Keep all follow-up visits as told by your health care provider. This is important.  Contact a health care provider if:  · You have bleeding that is heavier or lasts longer than a normal menstrual cycle.  · You have a fever.  · You have cramps or abdominal pain that get worse or do not get better with medicine.  · You develop abdominal pain that is new or is not in the same area of earlier cramping and pain.  · You feel lightheaded or weak.  · You have abnormal or bad-smelling discharge from your vagina.  · You have pain during sexual  activity.  · You have any of the following problems with your IUD string(s):  ? The string bothers or hurts you or your sexual partner.  ? You cannot feel the string.  ? The string has gotten longer.  · You can feel the IUD in your vagina.  · You think you may be pregnant, or you miss your menstrual period.  · You think you may have an STI (sexually transmitted infection).  Get help right away if:  · You have flu-like symptoms.  · You have a fever and chills.  · You can feel that your IUD has slipped out of place.  Summary  · After the procedure, it is common to have cramps and pain in the abdomen. It is also common to have light bleeding (spotting) or heavier bleeding that is like your menstrual period.  · Continue to check that the IUD is still in place by feeling for the string(s) after every menstrual period, or once a month.  · Keep all follow-up visits as told by your health care provider. This is important.  · Contact your health care provider if you have problems with your IUD string(s), such as the string getting longer or bothering you or your sexual partner.  This information is not intended to replace advice given to you by your health care provider. Make   sure you discuss any questions you have with your health care provider.  Document Released: 10/17/2010 Document Revised: 01/10/2016 Document Reviewed: 01/10/2016  Elsevier Interactive Patient Education © 2019 [employer]

## 2018-04-03 NOTE — Progress Notes (Signed)
Presents for IUD management.  C/o feeling the strings at the entrance to her vagina.  It was Inserted yesterday.

## 2018-04-03 NOTE — Progress Notes (Signed)
   GYNECOLOGY CLINIC PROCEDURE NOTE  Ms. Selena Malone is a 28 y.o. [id number] here with concerns that her IUD placed yesterday is coming out. Upon examination the IUD strings are longer than expected which likely indicate the IUD is in the LUS. Patient given option to have [place] to confirm or have IUD removed and replaced today. Patient opts for removal with replacement.    IUD Removal  Patient was in the dorsal lithotomy position, normal external genitalia was noted.  A speculum was placed in the patient's vagina, normal discharge was noted, no lesions. The multiparous cervix was visualized, no lesions, no abnormal discharge.  The strings of the IUD were grasped and pulled using ring forceps. The IUD was removed in its entirety. Patient tolerated the procedure well.    IUD Insertion  Patient identified, informed consent performed.  Discussed risks of irregular bleeding, cramping, infection, malpositioning or misplacement of the IUD outside the uterus which may require further procedure such as laparoscopy. Time out was performed.  Urine pregnancy test negative.  Speculum placed in the vagina.  Cervix visualized.  Cleaned with Betadine x 2.  Grasped anteriorly with a single tooth tenaculum.  Uterus sounded to 8 cm.  Mirena IUD placed per manufacturer's recommendations.  Strings trimmed to 3 cm. Tenaculum was removed, good hemostasis noted.  Patient tolerated procedure well.   Patient was given post-procedure instructions.  She was advised to be have backup contraception for one week.  Patient was also asked to check IUD strings periodically and follow up in 4 weeks for IUD check as scheduled.   Marny Lowenstein, PA-C 04/03/2018 10:13 AM

## 2018-04-30 ENCOUNTER — Ambulatory Visit: Payer: Medicaid Other | Admitting: Certified Nurse Midwife

## 2019-03-17 ENCOUNTER — Encounter: Payer: Self-pay | Admitting: Nurse Practitioner

## 2019-03-17 ENCOUNTER — Other Ambulatory Visit: Payer: Self-pay

## 2019-03-17 ENCOUNTER — Ambulatory Visit: Payer: 59 | Admitting: Nurse Practitioner

## 2019-03-17 VITALS — BP 120/84 | HR 116 | Temp 98.4°F | Ht 64.6 in | Wt 228.0 lb

## 2019-03-17 DIAGNOSIS — R519 Headache, unspecified: Secondary | ICD-10-CM

## 2019-03-17 DIAGNOSIS — F329 Major depressive disorder, single episode, unspecified: Secondary | ICD-10-CM | POA: Diagnosis not present

## 2019-03-17 DIAGNOSIS — F419 Anxiety disorder, unspecified: Secondary | ICD-10-CM | POA: Diagnosis not present

## 2019-03-17 DIAGNOSIS — G4700 Insomnia, unspecified: Secondary | ICD-10-CM | POA: Diagnosis not present

## 2019-03-17 DIAGNOSIS — R4586 Emotional lability: Secondary | ICD-10-CM

## 2019-03-17 DIAGNOSIS — F32A Depression, unspecified: Secondary | ICD-10-CM

## 2019-03-17 DIAGNOSIS — G8929 Other chronic pain: Secondary | ICD-10-CM

## 2019-03-17 DIAGNOSIS — Z13228 Encounter for screening for other metabolic disorders: Secondary | ICD-10-CM

## 2019-03-17 MED ORDER — AMITRIPTYLINE HCL 25 MG PO TABS: 25.0000 mg | ORAL_TABLET | Freq: Every day | ORAL | 2 refills | Status: DC

## 2019-03-17 NOTE — Progress Notes (Signed)
This visit occurred during the SARS-CoV-2 public health emergency.  Safety protocols were in place, including screening questions prior to the visit, additional usage of staff PPE, and extensive cleaning of exam room while observing appropriate contact time as indicated for disinfecting solutions.  Subjective:     Patient ID: Selena Malone , female    DOB: [birth year] , 29 y.o.   MRN: [id number]   Chief Complaint  Patient presents with  . Establish Care  . Depression    patient stated she would like to be given some meds  . Thyroid Problem    she would like bloodwork for her thyroid   . Migraine    HPI  Here to establish care has not seen a PCP in over 10 years.  She had been going to [facility] until last week due to see new provider Elon Alas at [facility]  Married, 2 children both healthy 73 yr old and 63 year old.  She is a stay at home mother.  During pregnancy she had vertigo.  PMH- she has a history of feeling like she can not control her emotions.   [facility] - does not know biological father.  Paternal aunt , Mother - smoker, graves disease and history of drug abuse.  Breast cancer is on her maternal side.  She has an IUD - with postpartum, anxiety was removed a few months ago. Cervical cancer and skin cancer.  2 sisters - healthy (alcohol abuse).   Chronic migraines.    She feels her feelings are worse since the Pandemic.  She is working with her son who is home schooling. She does not have much family to help her support.    When she drives long distances she will get a headache   Depression      The patient presents with depression.  This is a new problem.  The current episode started more than 1 month ago.   Onset quality: after having her daughter 1 year ago.   Associated symptoms include fatigue, insomnia and irritable.  Associated symptoms include no decreased concentration, no helplessness, no hopelessness, no restlessness, no body aches, no headaches and no suicidal  ideas.     Exacerbated by: pandemic and working with her son with virtual learning.  Risk factors include family history (grandfather committed suicide).   Past medical history includes thyroid problem, anxiety, depression and obsessive-compulsive disorder.     Pertinent negatives include no chronic illness, no dementia and no bipolar disorder. Thyroid Problem Presents for initial visit. Symptoms include fatigue. Patient reports no anxiety (not during the visit) or palpitations. Past treatments include nothing. Her past medical history is significant for obesity. There is no history of dementia or diabetes.  Migraine  This is a chronic problem. The current episode started more than 1 year ago. Associated symptoms include insomnia. Pertinent negatives include no abdominal pain. There is no history of cancer, hypertension, migraine headaches or obesity.     Past Medical History:  Diagnosis Date  . Iron deficiency anemia      Family History  Problem Relation Age of Onset  . Graves' disease Mother   . Cancer Paternal Aunt   . Thyroid disease Paternal Aunt   . Cancer Maternal Grandmother   . Depression Maternal Grandfather   . Cancer Paternal Grandfather     No current outpatient medications on file.   Allergies  Allergen Reactions  . Codeine Itching     Review of Systems  Constitutional: Positive for fatigue.  Cardiovascular: Negative for palpitations.  Gastrointestinal: Negative for abdominal pain.  Neurological: Negative for headaches.  Psychiatric/Behavioral: Positive for depression. Negative for agitation, decreased concentration and suicidal ideas. The patient has insomnia. The patient is not nervous/anxious (not during the visit).      Today's Vitals   03/17/19 1206  BP: 120/84  Pulse: (!) 116  Temp: 98.4 F (36.9 C)  TempSrc: Oral  Weight: 228 lb (103.4 kg)  Height: 5' 4.6" (1.641 m)  PainSc: 0-No pain   Body mass index is 38.41 kg/m.   Objective:  Physical  Exam Constitutional:      General: She is irritable.     Appearance: Normal appearance.  Cardiovascular:     Rate and Rhythm: Normal rate.  Pulmonary:     Effort: Pulmonary effort is normal. No respiratory distress.     Breath sounds: Normal breath sounds.  Skin:    Capillary Refill: Capillary refill takes less than 2 seconds.  Neurological:     General: No focal deficit present.     Mental Status: She is alert and oriented to person, place, and time.     Cranial Nerves: No cranial nerve deficit.  Psychiatric:        Mood and Affect: Mood normal.        Behavior: Behavior normal. Behavior is cooperative.        Thought Content: Thought content normal.        Judgment: Judgment normal.         Assessment And Plan:   1. Depression, unspecified depression type  Reports this as ongoing seems to be getting worse.  Will try her on low dose amitriptyline as this can help several issues  Denies homicidal or suicidal ideations  2. Anxiety  Progressively worsening according to the patient   Will try her on amitriptyline   Offered counseling however she is not interested at this time as she feels she knows her triggers.    I do recommend counseling in addition to medications to focus on coping with the root cause.   3. Insomnia, unspecified type  Worsening in recent months with the current pandemic and her children are in school from home  4. Chronic nonintractable headache, unspecified headache type  Will try the amitriptyline  5. Encounter for screening for metabolic disorder  - [id number] - Lipid Profile - TSH - CBC without diff  6. Mood swings  This may be related to her depression/anxiety  Will check for metabolic cause and hopefully the amitriptyline will be effective  I did discuss if there is a psychological component it would be best to seek care with a trained psychiatrist she may need a different type of medication - TSH    Minette Brine, FNP     THE PATIENT IS ENCOURAGED TO PRACTICE SOCIAL DISTANCING DUE TO THE COVID-19 PANDEMIC.

## 2019-03-18 LAB — CBC
Hematocrit: 38.5 % (ref 34.0–46.6)
Hemoglobin: 13.1 g/dL (ref 11.1–15.9)
MCH: 32.5 pg (ref 26.6–33.0)
MCHC: 34 g/dL (ref 31.5–35.7)
MCV: 96 fL (ref 79–97)
Platelets: 230 10*3/uL (ref 150–450)
RBC: 4.03 x10E6/uL (ref 3.77–5.28)
RDW: 12.5 % (ref 11.7–15.4)
WBC: 5.1 10*3/uL (ref 3.4–10.8)

## 2019-03-18 LAB — LIPID PANEL
Chol/HDL Ratio: 4.6 ratio — ABNORMAL HIGH (ref 0.0–4.4)
Cholesterol, Total: 147 mg/dL (ref 100–199)
HDL: 32 mg/dL — ABNORMAL LOW (ref >39)
LDL Chol Calc (NIH): 71 mg/dL (ref 0–99)
Triglycerides: 272 mg/dL — ABNORMAL HIGH (ref 0–149)
VLDL Cholesterol Cal: 44 mg/dL — ABNORMAL HIGH (ref 5–40)

## 2019-03-18 LAB — CMP14+EGFR
ALT: 18 IU/L (ref 0–32)
AST: 18 IU/L (ref 0–40)
Albumin/Globulin Ratio: 2 (ref 1.2–2.2)
Albumin: 4.5 g/dL (ref 3.8–4.8)
Alkaline Phosphatase: 125 IU/L — ABNORMAL HIGH (ref 39–117)
BUN/Creatinine Ratio: 12 (ref 9–23)
BUN: 11 mg/dL (ref 6–20)
Bilirubin Total: 0.2 mg/dL (ref 0.0–1.2)
CO2: 24 mmol/L (ref 20–29)
Calcium: 9.5 mg/dL (ref 8.7–10.2)
Chloride: 103 mmol/L (ref 96–106)
Creatinine, Ser: 0.9 mg/dL (ref 0.57–1.00)
GFR calc Af Amer: 99 mL/min/{1.73_m2} (ref >59)
GFR calc non Af Amer: 85 mL/min/{1.73_m2} (ref >59)
Globulin, Total: 2.2 g/dL (ref 1.5–4.5)
Glucose: 94 mg/dL (ref 65–99)
Potassium: 4.3 mmol/L (ref 3.5–5.2)
Sodium: 141 mmol/L (ref 134–144)
Total Protein: 6.7 g/dL (ref 6.0–8.5)

## 2019-03-18 LAB — TSH: TSH: 0.988 u[IU]/mL (ref 0.450–4.500)

## 2019-03-19 ENCOUNTER — Encounter: Payer: Self-pay | Admitting: Nurse Practitioner

## 2019-03-22 ENCOUNTER — Other Ambulatory Visit: Payer: Self-pay | Admitting: Nurse Practitioner

## 2019-03-22 DIAGNOSIS — F419 Anxiety disorder, unspecified: Secondary | ICD-10-CM

## 2019-03-22 DIAGNOSIS — F329 Major depressive disorder, single episode, unspecified: Secondary | ICD-10-CM

## 2019-03-22 DIAGNOSIS — F32A Depression, unspecified: Secondary | ICD-10-CM

## 2019-03-22 MED ORDER — AMITRIPTYLINE HCL 10 MG PO TABS: 25.0000 mg | ORAL_TABLET | Freq: Every day | ORAL | 2 refills | Status: DC

## 2019-03-22 MED ORDER — AMITRIPTYLINE HCL 10 MG PO TABS: 10.0000 mg | ORAL_TABLET | Freq: Every day | ORAL | 2 refills | Status: DC

## 2019-03-23 ENCOUNTER — Other Ambulatory Visit: Payer: Self-pay

## 2019-03-24 ENCOUNTER — Encounter: Payer: Self-pay | Admitting: Women's Health

## 2019-03-24 ENCOUNTER — Ambulatory Visit: Payer: 59 | Admitting: Women's Health

## 2019-03-24 VITALS — BP 130/82 | Ht 64.0 in | Wt 228.0 lb

## 2019-03-24 DIAGNOSIS — Z01419 Encounter for gynecological examination (general) (routine) without abnormal findings: Secondary | ICD-10-CM | POA: Diagnosis not present

## 2019-03-24 DIAGNOSIS — Z1151 Encounter for screening for human papillomavirus (HPV): Secondary | ICD-10-CM | POA: Diagnosis not present

## 2019-03-24 MED ORDER — DROSPIRENONE-ETHINYL ESTRADIOL 3-0.02 MG PO TABS: 1.0000 | ORAL_TABLET | Freq: Every day | ORAL | 4 refills | Status: DC

## 2019-03-24 NOTE — Patient Instructions (Signed)
It was good to meet you! Vit D 1000 iu daily Health Maintenance, Female Adopting a healthy lifestyle and getting preventive care are important in promoting health and wellness. Ask your health care provider about:  The right schedule for you to have regular tests and exams.  Things you can do on your own to prevent diseases and keep yourself healthy. What should I know about diet, weight, and exercise? Eat a healthy diet   Eat a diet that includes plenty of vegetables, fruits, low-fat dairy products, and lean protein.  Do not eat a lot of foods that are high in solid fats, added sugars, or sodium. Maintain a healthy weight Body mass index (BMI) is used to identify weight problems. It estimates body fat based on height and weight. Your health care provider can help determine your BMI and help you achieve or maintain a healthy weight. Get regular exercise Get regular exercise. This is one of the most important things you can do for your health. Most adults should:  Exercise for at least 150 minutes each week. The exercise should increase your heart rate and make you sweat (moderate-intensity exercise).  Do strengthening exercises at least twice a week. This is in addition to the moderate-intensity exercise.  Spend less time sitting. Even light physical activity can be beneficial. Watch cholesterol and blood lipids Have your blood tested for lipids and cholesterol at 31 years of age, then have this test every 5 years. Have your cholesterol levels checked more often if:  Your lipid or cholesterol levels are high.  You are older than 31 years of age.  You are at high risk for heart disease. What should I know about cancer screening? Depending on your health history and family history, you may need to have cancer screening at various ages. This may include screening for:  Breast cancer.  Cervical cancer.  Colorectal cancer.  Skin cancer.  Lung cancer. What should I know about  heart disease, diabetes, and high blood pressure? Blood pressure and heart disease  High blood pressure causes heart disease and increases the risk of stroke. This is more likely to develop in people who have high blood pressure readings, are of African descent, or are overweight.  Have your blood pressure checked: ? Every 3-5 years if you are 8-61 years of age. ? Every year if you are 4 years old or older. Diabetes Have regular diabetes screenings. This checks your fasting blood sugar level. Have the screening done:  Once every three years after age 48 if you are at a normal weight and have a low risk for diabetes.  More often and at a younger age if you are overweight or have a high risk for diabetes. What should I know about preventing infection? Hepatitis B If you have a higher risk for hepatitis B, you should be screened for this virus. Talk with your health care provider to find out if you are at risk for hepatitis B infection. Hepatitis C Testing is recommended for:  Everyone born from [birth year] through 1967.  Anyone with known risk factors for hepatitis C. Sexually transmitted infections (STIs)  Get screened for STIs, including gonorrhea and chlamydia, if: ? You are sexually active and are younger than 31 years of age. ? You are older than 31 years of age and your health care provider tells you that you are at risk for this type of infection. ? Your sexual activity has changed since you were last screened, and you are at increased  risk for chlamydia or gonorrhea. Ask your health care provider if you are at risk.  Ask your health care provider about whether you are at high risk for HIV. Your health care provider may recommend a prescription medicine to help prevent HIV infection. If you choose to take medicine to prevent HIV, you should first get tested for HIV. You should then be tested every 3 months for as long as you are taking the medicine. Pregnancy  If you are about to  stop having your period (premenopausal) and you may become pregnant, seek counseling before you get pregnant.  Take 400 to 800 micrograms (mcg) of folic acid every day if you become pregnant.  Ask for birth control (contraception) if you want to prevent pregnancy. Osteoporosis and menopause Osteoporosis is a disease in which the bones lose minerals and strength with aging. This can result in bone fractures. If you are 65 years old or older, or if you are at risk for osteoporosis and fractures, ask your health care provider if you should:  Be screened for bone loss.  Take a calcium or vitamin D supplement to lower your risk of fractures.  Be given hormone replacement therapy (HRT) to treat symptoms of menopause. Follow these instructions at home: Lifestyle  Do not use any products that contain nicotine or tobacco, such as cigarettes, e-cigarettes, and chewing tobacco. If you need help quitting, ask your health care provider.  Do not use street drugs.  Do not share needles.  Ask your health care provider for help if you need support or information about quitting drugs. Alcohol use  Do not drink alcohol if: ? Your health care provider tells you not to drink. ? You are pregnant, may be pregnant, or are planning to become pregnant.  If you drink alcohol: ? Limit how much you use to 0-1 drink a day. ? Limit intake if you are breastfeeding.  Be aware of how much alcohol is in your drink. In the [place] one drink equals one 12 oz bottle of beer (355 mL), one 5 oz glass of wine (148 mL), or one 1 oz glass of hard liquor (44 mL). General instructions  Schedule regular health, dental, and eye exams.  Stay current with your vaccines.  Tell your health care provider if: ? You often feel depressed. ? You have ever been abused or do not feel safe at home. Summary  Adopting a healthy lifestyle and getting preventive care are important in promoting health and wellness.  Follow your  health care provider's instructions about healthy diet, exercising, and getting tested or screened for diseases.  Follow your health care provider's instructions on monitoring your cholesterol and blood pressure. This information is not intended to replace advice given to you by your health care provider. Make sure you discuss any questions you have with your health care provider. Document Revised: 02/11/2018 Document Reviewed: 02/11/2018 Elsevier Patient Education  2020 Elsevier Inc.  

## 2019-03-24 NOTE — Progress Notes (Signed)
Selena Malone 1990/03/20 [id number]    History: 29 yo MWF G3P2 presents for annual exam. Regular monthly cycle since Mirena IUD removal in October 2020. Daughter born last year, postpartum depression, stable on amitriptyline. Sexually active with husband, declines need for STD screen. Would like to begin birth control pills. Weight gain since birth of daughter, walks 1/2-1 mile a night for exercise. Normal pap history. No other medical problems.   Past medical history, past surgical history, family history and social history were all reviewed and documented in the EPIC chart. Homemaker. Healthy daughter 24 yo, Son 62 yo. Married 3 years. Father deceased lung cancer. Grandmother breast cancer survivor. No contact with mother or in laws.   ROS:  A ROS was performed and pertinent positives and negatives are included.  Exam:  Vitals:   03/24/19 1042  BP: 130/82  Weight: 228 lb (103.4 kg)  Height: 5\' 4"  (1.626 m)   Body mass index is 39.14 kg/m.   General appearance:  Normal Thyroid:  Symmetrical, normal in size, without palpable masses or nodularity. Respiratory  Auscultation:  Clear without wheezing or rhonchi Cardiovascular  Auscultation:  Regular rate, without rubs, murmurs or gallops  Edema/varicosities:  Not grossly evident Abdominal  Soft,nontender, without masses, guarding or rebound.  Liver/spleen:  No organomegaly noted  Hernia:  None appreciated  Skin  Inspection:  Grossly normal   Breasts: Examined lying and sitting.     Right: Without masses, retractions, discharge or axillary adenopathy.     Left: Without masses, retractions, discharge or axillary adenopathy. Gentitourinary   Inguinal/mons:  Normal without inguinal adenopathy  External genitalia:  Normal  BUS/Urethra/Skene's glands:  Normal  Vagina:  Normal  Cervix:  Normal  Uterus:  normal in size, shape and contour.  Midline and mobile  Adnexa/parametria:     Rt: Without masses or  tenderness.   Lt: Without masses or tenderness.  Anus and perineum: Normal   Assessment/Plan:  29 y.o.  MWF G3P2 for annual exam and contraception management.  Regular monthly cycle/condoms Contraception management  Depression-labs and meds managed by PCP, stable on amitriptyline Obesity   Plan: contraceptive options reviewed, Rx yaz, stroke and blood clot risk reviewed, recommended condom for first month. Instructed to call if adverse symptoms with yaz. Recommended increasing cardio exercise, low carb/calories, healthy diet/weight watchers. Encouraged self care and SBE's.  Pap with HR HPV, new screening guidelines reviewed.   [address] [facility], 11:22 AM 03/24/2019

## 2019-03-25 LAB — PAP, TP IMAGING W/ HPV RNA, RFLX HPV TYPE 16,18/45: HPV DNA High Risk: NOT DETECTED

## 2019-03-26 ENCOUNTER — Encounter: Payer: Self-pay | Admitting: Nurse Practitioner

## 2019-04-15 ENCOUNTER — Ambulatory Visit: Payer: 59 | Admitting: Nurse Practitioner

## 2019-04-15 ENCOUNTER — Other Ambulatory Visit: Payer: Self-pay

## 2019-04-15 ENCOUNTER — Encounter: Payer: Self-pay | Admitting: Nurse Practitioner

## 2019-04-15 VITALS — BP 130/84 | HR 99 | Temp 98.4°F | Ht 64.6 in | Wt 226.6 lb

## 2019-04-15 DIAGNOSIS — G4700 Insomnia, unspecified: Secondary | ICD-10-CM | POA: Diagnosis not present

## 2019-04-15 DIAGNOSIS — R1010 Upper abdominal pain, unspecified: Secondary | ICD-10-CM

## 2019-04-15 DIAGNOSIS — F329 Major depressive disorder, single episode, unspecified: Secondary | ICD-10-CM | POA: Diagnosis not present

## 2019-04-15 DIAGNOSIS — F419 Anxiety disorder, unspecified: Secondary | ICD-10-CM | POA: Diagnosis not present

## 2019-04-15 DIAGNOSIS — F32A Depression, unspecified: Secondary | ICD-10-CM

## 2019-04-15 DIAGNOSIS — K5900 Constipation, unspecified: Secondary | ICD-10-CM

## 2019-04-15 MED ORDER — VORTIOXETINE HBR 5 MG PO TABS: 5.0000 mg | ORAL_TABLET | Freq: Every day | ORAL | 2 refills | Status: DC

## 2019-04-15 NOTE — Progress Notes (Signed)
This visit occurred during the SARS-CoV-2 public health emergency.  Safety protocols were in place, including screening questions prior to the visit, additional usage of staff PPE, and extensive cleaning of exam room while observing appropriate contact time as indicated for disinfecting solutions.  Subjective:     Patient ID: Selena Malone , female    DOB: March 06, 1990 , 29 y.o.   MRN: [id number]   Chief Complaint  Patient presents with  . Depression    patient presents today for a med check  on amtriptyline. she stated the med isnt working as well     HPI  She reports her husband feels she is not quick tempered but she is bottled up. She is trying harder not to allow the feelings to breakthrough. She also feels she has been more constipation.   She does not feel the amitriptylline has been beneficial.    She is planning to go to a therapist in [place].     Depression      The patient presents with depression.  This is a new problem.  The current episode started more than 1 year ago.   The onset quality is sudden.   Associated symptoms include no decreased concentration, no fatigue, no hopelessness, no body aches and no myalgias.  Past treatments include SSRIs - Selective serotonin reuptake inhibitors.  Compliance with treatment is good.  Past medical history includes anxiety and depression.     Pertinent negatives include no chronic fatigue syndrome, no chronic pain and no schizophrenia.    Past Medical History:  Diagnosis Date  . Iron deficiency anemia      Family History  Problem Relation Age of Onset  . Graves' disease Mother   . Cancer Paternal Aunt   . Thyroid disease Paternal Aunt   . Cancer Maternal Grandmother   . Depression Maternal Grandfather   . Cancer Paternal Grandfather      Current Outpatient Medications:  .  amitriptyline (ELAVIL) 10 MG tablet, Take 1 tablet (10 mg total) by mouth at bedtime., Disp: 30 tablet, Rfl: 2 .  drospirenone-ethinyl  estradiol (YAZ) 3-0.02 MG tablet, Take 1 tablet by mouth daily., Disp: 3 Package, Rfl: 4 .  magnesium 30 MG tablet, Take 30 mg by mouth 2 (two) times daily., Disp: , Rfl:    Allergies  Allergen Reactions  . Codeine Itching     Review of Systems  Constitutional: Negative.  Negative for fatigue.  Respiratory: Negative.   Cardiovascular: Negative.  Negative for chest pain, palpitations and leg swelling.  Endocrine: Negative for polydipsia, polyphagia and polyuria.  Musculoskeletal: Negative for myalgias.  Neurological: Negative for dizziness.  Psychiatric/Behavioral: Positive for depression. Negative for decreased concentration.     Today's Vitals   04/15/19 1112  BP: 130/84  Pulse: 99  Temp: 98.4 F (36.9 C)  TempSrc: Oral  Weight: 226 lb 9.6 oz (102.8 kg)  Height: 5' 4.6" (1.641 m)  PainSc: 0-No pain   Body mass index is 38.18 kg/m.   Objective:  Physical Exam Constitutional:      Appearance: Normal appearance.  Cardiovascular:     Rate and Rhythm: Normal rate and regular rhythm.     Pulses: Normal pulses.     Heart sounds: Normal heart sounds. No murmur.  Pulmonary:     Effort: Pulmonary effort is normal. No respiratory distress.     Breath sounds: Normal breath sounds.  Skin:    Capillary Refill: Capillary refill takes less than 2 seconds.  Neurological:  General: No focal deficit present.     Mental Status: She is alert and oriented to person, place, and time.  Psychiatric:        Mood and Affect: Mood normal.        Behavior: Behavior normal.        Thought Content: Thought content normal.        Judgment: Judgment normal.         Assessment And Plan:    1. Depression, unspecified depression type  amitriptylline is not effective  Stop the amitriptylline and start trintellix  She is encouraged to seek counseling and she is planning to go to a therapist in [place].  - vortioxetine HBr (TRINTELLIX) 5 MG TABS tablet; Take 1 tablet (5 mg total) by  mouth daily.  Dispense: 30 tablet; Refill: 2  2. Anxiety  Slightly better will be switching to trintellix which may be effective for anxiety as well  She does feel some relief - vortioxetine HBr (TRINTELLIX) 5 MG TABS tablet; Take 1 tablet (5 mg total) by mouth daily.  Dispense: 30 tablet; Refill: 2  3. Insomnia, unspecified type  Not much improved with the amitriptylline  Encouraged to take magnesium in evening as well   Arnette Felts, FNP    THE PATIENT IS ENCOURAGED TO PRACTICE SOCIAL DISTANCING DUE TO THE COVID-19 PANDEMIC.

## 2019-04-16 LAB — CMP14+EGFR
ALT: 18 IU/L (ref 0–32)
AST: 19 IU/L (ref 0–40)
Albumin/Globulin Ratio: 1.8 (ref 1.2–2.2)
Albumin: 4.2 g/dL (ref 3.8–4.8)
Alkaline Phosphatase: 131 IU/L — ABNORMAL HIGH (ref 39–117)
BUN/Creatinine Ratio: 10 (ref 9–23)
BUN: 10 mg/dL (ref 6–20)
Bilirubin Total: 0.3 mg/dL (ref 0.0–1.2)
CO2: 22 mmol/L (ref 20–29)
Calcium: 9.5 mg/dL (ref 8.7–10.2)
Chloride: 105 mmol/L (ref 96–106)
Creatinine, Ser: 0.98 mg/dL (ref 0.57–1.00)
GFR calc Af Amer: 89 mL/min/{1.73_m2} (ref >59)
GFR calc non Af Amer: 77 mL/min/{1.73_m2} (ref >59)
Globulin, Total: 2.4 g/dL (ref 1.5–4.5)
Glucose: 101 mg/dL — ABNORMAL HIGH (ref 65–99)
Potassium: 4.9 mmol/L (ref 3.5–5.2)
Sodium: 140 mmol/L (ref 134–144)
Total Protein: 6.6 g/dL (ref 6.0–8.5)

## 2019-04-16 LAB — CBC
Hematocrit: 37.6 % (ref 34.0–46.6)
Hemoglobin: 12.7 g/dL (ref 11.1–15.9)
MCH: 31.8 pg (ref 26.6–33.0)
MCHC: 33.8 g/dL (ref 31.5–35.7)
MCV: 94 fL (ref 79–97)
Platelets: 246 10*3/uL (ref 150–450)
RBC: 4 x10E6/uL (ref 3.77–5.28)
RDW: 12 % (ref 11.7–15.4)
WBC: 5 10*3/uL (ref 3.4–10.8)

## 2019-04-21 ENCOUNTER — Encounter: Payer: Self-pay | Admitting: Nurse Practitioner

## 2019-05-19 ENCOUNTER — Ambulatory Visit: Payer: 59 | Admitting: Family Medicine

## 2019-05-19 ENCOUNTER — Encounter: Payer: Self-pay | Admitting: Family Medicine

## 2019-05-19 ENCOUNTER — Other Ambulatory Visit: Payer: Self-pay

## 2019-05-19 VITALS — BP 120/70 | HR 70 | Temp 97.8°F | Ht 65.5 in | Wt 224.8 lb

## 2019-05-19 DIAGNOSIS — R748 Abnormal levels of other serum enzymes: Secondary | ICD-10-CM

## 2019-05-19 DIAGNOSIS — F329 Major depressive disorder, single episode, unspecified: Secondary | ICD-10-CM

## 2019-05-19 DIAGNOSIS — R1031 Right lower quadrant pain: Secondary | ICD-10-CM | POA: Diagnosis not present

## 2019-05-19 DIAGNOSIS — R519 Headache, unspecified: Secondary | ICD-10-CM

## 2019-05-19 DIAGNOSIS — G4440 Drug-induced headache, not elsewhere classified, not intractable: Secondary | ICD-10-CM | POA: Insufficient documentation

## 2019-05-19 DIAGNOSIS — F32A Depression, unspecified: Secondary | ICD-10-CM | POA: Insufficient documentation

## 2019-05-19 DIAGNOSIS — F419 Anxiety disorder, unspecified: Secondary | ICD-10-CM | POA: Diagnosis not present

## 2019-05-19 DIAGNOSIS — G8929 Other chronic pain: Secondary | ICD-10-CM

## 2019-05-19 NOTE — Progress Notes (Signed)
Subjective:    Patient ID: Selena Malone, female    DOB: 1990/10/02, 29 y.o.   MRN: [id number]  HPI Chief Complaint  Patient presents with  . new pt    new pt get established, not on med as she trying to get pregnant elevated liver enzymes but not sure why, appendenix issues- painful and tenderness x 1 month   She is new to the practice and here to establish care.  Previous medical care: PCP and OB/GYN  Other providers: OB/GYN- Elon Alas [facility]    Reports having issues with anxiety and depression for the past 10-15 years.  States she has some OCD as well.  Started on amitryptyline 25 mg and it was too strong. Reduced to 10 mg and did not feel that it was helping. She was prescribed Trintellix but this was never approved.  States she is not interested in medication for this now. Managing ok. Wants to get pregnant.   Stopped birth control.   Complains of a 2 month history of intermittent RLQ pain. Pain occurs 5 times per day.  Becoming more frequent and worse.  Not affected by eating or bowel movements.  No fever, chills, vomiting. Occasionally has hard stools and when passing large stools she sees blood on the tissue. Thinks she has hemorrhoids.   Having more indigestion lately.   Takes cetirizine for hay fever.   Headaches since age 56. She believes these are migraines. No aura. Sharp and stabbing pain usually unilateral.  No vomiting. Noises and lights bother her. Occasionally has nausea.   States she thinks these are more frequent in spring and fall. Having 2 per week. Uses Excedrin or ibuprofen. Has taken 2 Aleve without any relief.    Takes Benadryl and ibuprofen in the evening sometimes.  Has never seen a neurologist.  Denies ever trying a triptan.   No fever, chills, dizziness, chest pain, shortness of breath, urinary or vaginal issues.    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     Review of Systems Pertinent positives and negatives in the history of present illness.     Objective:   Physical Exam Constitutional:      General: She is not in acute distress.    Appearance: Normal appearance. She is not ill-appearing.  Eyes:     General: Lids are normal.     Extraocular Movements: Extraocular movements intact.     Conjunctiva/sclera: Conjunctivae normal.  Cardiovascular:     Rate and Rhythm: Normal rate and regular rhythm.     Pulses: Normal pulses.     Heart sounds: Normal heart sounds.  Pulmonary:     Effort: Pulmonary effort is normal.     Breath sounds: Normal breath sounds.  Abdominal:     General: Abdomen is flat. Bowel sounds are normal. There is no distension.     Palpations: Abdomen is soft. There is no mass.     Tenderness: There is abdominal tenderness in the right lower quadrant. There is no right CVA tenderness, left CVA tenderness, guarding or rebound. Negative signs include Murphy's sign, Rovsing's sign, McBurney's sign and psoas sign.  Musculoskeletal:     Cervical back: Normal range of motion and neck supple.     Right lower leg: No edema.  Left lower leg: No edema.  Skin:    General: Skin is warm and dry.  Neurological:     Mental Status: She is alert and oriented to person, place, and time.     Cranial Nerves: Cranial nerves are intact.     Sensory: Sensation is intact.     Motor: Motor function is intact.     Coordination: Coordination is intact.  Psychiatric:        Attention and Perception: Attention normal.        Mood and Affect: Mood normal.        Speech: Speech normal.        Behavior: Behavior normal.        Cognition and Memory: Cognition normal.    BP 120/70   Pulse 70   Temp 97.8 F (36.6 C)   Ht 5' 5.5" (1.664 m)   Wt 224 lb 12.8 oz (102 kg)   LMP  05/17/2019   Breastfeeding No   BMI 36.84 kg/m       Assessment & Plan:  Right lower quadrant abdominal pain  Anxiety and depression  Chronic nonintractable headache, unspecified headache type  Elevated alkaline phosphatase level  He is a pleasant 29 year old female who is new to the practice and here to establish care. She plans on getting pregnant again very soon so she declines medications today. Reviewed recent labs and she has had a mildly elevated alk phos with normal liver enzymes. Discussed possible etiologies for her right lower quadrant pain.  Pain is intermittent.  Unlikely appendicitis based on history and examination today.  Discussed that this may be due to gas or hard stools which she has occasionally. No sign of an infectious process.  She will keep an eye on her symptoms and follow-up if worsening. She has tried medication for anxiety and depression.  She currently is managing fine and declines medication. Headaches which are seasonal and have characteristics of both tension and migraine.  We will keep an eye on her triggers and symptoms and she will follow-up. Appears to be overdue for CPE but she does see her OB/GYN regularly.  She will follow-up with me in the next couple of months for a physical and to discuss chronic health conditions.

## 2019-05-27 ENCOUNTER — Ambulatory Visit: Payer: 59 | Admitting: Nurse Practitioner

## 2019-05-29 ENCOUNTER — Ambulatory Visit: Payer: Medicaid Other | Attending: Internal Medicine

## 2019-05-29 DIAGNOSIS — Z23 Encounter for immunization: Secondary | ICD-10-CM

## 2019-05-29 NOTE — Progress Notes (Signed)
   Covid-19 Vaccination Clinic  Name:  Selena Malone    MRN: [id number] DOB: Feb 09, 1991  05/29/2019  Selena Malone was observed post Covid-19 immunization for 15 minutes without incident. She was provided with Vaccine Information Sheet and instruction to access the V-Safe system.   Selena Malone was instructed to call 911 with any severe reactions post vaccine: Marland Kitchen Difficulty breathing  . Swelling of face and throat  . A fast heartbeat  . A bad rash all over body  . Dizziness and weakness   Immunizations Administered    Name Date Dose VIS Date Route   Pfizer COVID-19 Vaccine 05/29/2019  4:16 PM 0.3 mL 02/12/2019 Intramuscular   Manufacturer: [employer], [employer]   Lot: [id number]   NDC: [address]-7

## 2019-06-15 NOTE — Patient Instructions (Addendum)
Dermatology offices  [facility]: Phone #: [phone] Address: [address], [place], [place] [address]  [facility]: Phone: [phone]  Address: [address], [place], [place] [address]  [facility] Address: [address], [place], [place] [address] Phone: [phone]    Preventive Care 21-31 Years Old, Female Preventive care refers to visits with your health care provider and lifestyle choices that can promote health and wellness. This includes:  A yearly physical exam. This may also be called an annual well check.  Regular dental visits and eye exams.  Immunizations.  Screening for certain conditions.  Healthy lifestyle choices, such as eating a healthy diet, getting regular exercise, not using drugs or products that contain nicotine and tobacco, and limiting alcohol use. What can I expect for my preventive care visit? Physical exam Your health care provider will check your:  Height and weight. This may be used to calculate body mass index (BMI), which tells if you are at a healthy weight.  Heart rate and blood pressure.  Skin for abnormal spots. Counseling Your health care provider may ask you questions about your:  Alcohol, tobacco, and drug use.  Emotional well-being.  Home and relationship well-being.  Sexual activity.  Eating habits.  Work and work environment.  Method of birth control.  Menstrual cycle.  Pregnancy history. What immunizations do I need?  Influenza (flu) vaccine  This is recommended every year. Tetanus, diphtheria, and pertussis (Tdap) vaccine  You may need a Td booster every 10 years. Varicella (chickenpox) vaccine  You may need this if you have not been vaccinated. Human papillomavirus (HPV) vaccine  If recommended by your health care provider, you may need three doses over 6 months. Measles, mumps, and rubella (MMR) vaccine  You may need at least one dose of MMR. You  may also need a second dose. Meningococcal conjugate (MenACWY) vaccine  One dose is recommended if you are age 19-21 years and a first-year college student living in a residence hall, or if you have one of several medical conditions. You may also need additional booster doses. Pneumococcal conjugate (PCV13) vaccine  You may need this if you have certain conditions and were not previously vaccinated. Pneumococcal polysaccharide (PPSV23) vaccine  You may need one or two doses if you smoke cigarettes or if you have certain conditions. Hepatitis A vaccine  You may need this if you have certain conditions or if you travel or work in places where you may be exposed to hepatitis A. Hepatitis B vaccine  You may need this if you have certain conditions or if you travel or work in places where you may be exposed to hepatitis B. Haemophilus influenzae type b (Hib) vaccine  You may need this if you have certain conditions. You may receive vaccines as individual doses or as more than one vaccine together in one shot (combination vaccines). Talk with your health care provider about the risks and benefits of combination vaccines. What tests do I need?  Blood tests  Lipid and cholesterol levels. These may be checked every 5 years starting at age 20.  Hepatitis C test.  Hepatitis B test. Screening  Diabetes screening. This is done by checking your blood sugar (glucose) after you have not eaten for a while (fasting).  Sexually transmitted disease (STD) testing.  BRCA-related cancer screening. This may be done if you have a family history of breast, ovarian, tubal, or peritoneal cancers.  Pelvic exam and Pap test. This may be done every 3   years starting at age 21. Starting at age 30, this may be done every 5 years if you have a Pap test in combination with an HPV test. Talk with your health care provider about your test results, treatment options, and if necessary, the need for more tests. Follow  these instructions at home: Eating and drinking   Eat a diet that includes fresh fruits and vegetables, whole grains, lean protein, and low-fat dairy.  Take vitamin and mineral supplements as recommended by your health care provider.  Do not drink alcohol if: ? Your health care provider tells you not to drink. ? You are pregnant, may be pregnant, or are planning to become pregnant.  If you drink alcohol: ? Limit how much you have to 0-1 drink a day. ? Be aware of how much alcohol is in your drink. In the [place] one drink equals one 12 oz bottle of beer (355 mL), one 5 oz glass of wine (148 mL), or one 1 oz glass of hard liquor (44 mL). Lifestyle  Take daily care of your teeth and gums.  Stay active. Exercise for at least 30 minutes on 5 or more days each week.  Do not use any products that contain nicotine or tobacco, such as cigarettes, e-cigarettes, and chewing tobacco. If you need help quitting, ask your health care provider.  If you are sexually active, practice safe sex. Use a condom or other form of birth control (contraception) in order to prevent pregnancy and STIs (sexually transmitted infections). If you plan to become pregnant, see your health care provider for a preconception visit. What's next?  Visit your health care provider once a year for a well check visit.  Ask your health care provider how often you should have your eyes and teeth checked.  Stay up to date on all vaccines. This information is not intended to replace advice given to you by your health care provider. Make sure you discuss any questions you have with your health care provider. Document Revised: 10/30/2017 Document Reviewed: 10/30/2017 Elsevier Patient Education  2020 Elsevier Inc.  

## 2019-06-15 NOTE — Progress Notes (Signed)
Subjective:    Patient ID: Selena Malone, female    DOB: 24-Oct-1990, 29 y.o.   MRN: [id number]  HPI Chief Complaint  Patient presents with  . cpe    non fasting cpe, sees obgyn, no other concerns   She is here for a complete physical exam. Previous medical care: Penelope Galas, NP   Other providers: OB/GYN- Maryelizabeth Rowan at [facility] .   Stopped birth control in January States her period is late by 6 days. States her period is usually regular. Negative home pregnancy test.   Mother has thyroid disease.   Social history: Lives with husband and kids, she is a stay at home mom Denies smoking, alcohol or drug use  Diet: fairly healthy. Fruits and vegetables. Occasional fast food  Excerise: biking  Immunizations: UTD. First Covid vaccine received.   Health maintenance:  Mammogram: never  Colonoscopy: never  Last Gynecological Exam: [facility] 2021 Last Menstrual cycle: 05/17/19 Last Dental Exam: 5 years ago  Last Eye Exam: years ago   Wears seatbelt always, uses sunscreen, smoke detectors in home and functioning, does not text while driving and feels safe in home environment.   Reviewed allergies, medications, past medical, surgical, family, and social history.   Review of Systems Review of Systems Constitutional: -fever, -chills, -sweats, -unexpected weight change,-fatigue ENT: -runny nose, -ear pain, -sore throat Cardiology:  -chest pain, -palpitations, -edema Respiratory: -cough, -shortness of breath, -wheezing Gastroenterology: -abdominal pain, -nausea, -vomiting, -diarrhea, -constipation  Hematology: -bleeding or bruising problems Musculoskeletal: -arthralgias, -myalgias, -joint swelling, -back pain Ophthalmology: -vision changes Urology: -dysuria, -difficulty urinating, -hematuria, -urinary frequency, -urgency Neurology: -headache, -weakness, -tingling, -numbness       Objective:   Physical Exam BP 120/80   Pulse (!) 112   Temp 98.6 F (37 C)   Ht  5\' 5"  (1.651 m)   Wt 222 lb (100.7 kg)   LMP 05/17/2019   Breastfeeding No   BMI 36.94 kg/m   General Appearance:    Alert, cooperative, no distress, appears stated age  Head:    Normocephalic, without obvious abnormality, atraumatic  Eyes:    PERRL, conjunctiva/corneas clear, EOM's intact  Ears:    Normal TM's and external ear canals  Nose:   Mask in place   Throat:   Mask in place   Neck:   Supple, no lymphadenopathy;  thyroid:  no   enlargement/tenderness/nodules; no JVD  Back:    Spine nontender, ROM normal, no CVA tenderness  Lungs:     Clear to auscultation bilaterally without wheezes, rales or     ronchi; respirations unlabored  Chest Wall:    No tenderness or deformity   Heart:    Regular rate and rhythm, S1 and S2 normal, no murmur, rub   or gallop  Breast Exam:    OB/GYN  Abdomen:     Soft, non-tender, nondistended, normoactive bowel sounds,    no masses, no hepatosplenomegaly  Genitalia:    OB/GYN  Rectal:    Not performed due to age<40 and no related complaints  Extremities:   No clubbing, cyanosis or edema  Pulses:   2+ and symmetric all extremities  Skin:   Skin color, texture, turgor normal, no rashes or lesions  Lymph nodes:   Cervical, supraclavicular, and axillary nodes normal  Neurologic:   CNII-XII intact, normal strength, sensation and gait          Psych:   Normal mood, affect, hygiene and grooming.         Assessment &  Plan:  Routine general medical examination at a health care facility - Plan: CBC with Differential/Platelet, Comprehensive metabolic panel, TSH, T4, free -discussed and reviewed preventive health care. She sees OB/GYN Counseling on healthy lifestyle including diet and exercise.  Discussed safety.  Reviewed immunizations.   Family history of thyroid disease in mother - Plan: TSH, T4, free -will check thyroid function and follow up  Late menses - Plan: TSH, T4, free, Beta hCG quant (ref lab) -negative HPT. Check quant and follow up

## 2019-06-16 ENCOUNTER — Other Ambulatory Visit: Payer: Self-pay

## 2019-06-16 ENCOUNTER — Ambulatory Visit: Payer: 59 | Admitting: Family Medicine

## 2019-06-16 ENCOUNTER — Encounter: Payer: Self-pay | Admitting: Family Medicine

## 2019-06-16 VITALS — BP 120/80 | HR 112 | Temp 98.6°F | Ht 65.0 in | Wt 222.0 lb

## 2019-06-16 DIAGNOSIS — Z0000 Encounter for general adult medical examination without abnormal findings: Secondary | ICD-10-CM | POA: Diagnosis not present

## 2019-06-16 DIAGNOSIS — N926 Irregular menstruation, unspecified: Secondary | ICD-10-CM

## 2019-06-16 DIAGNOSIS — Z8349 Family history of other endocrine, nutritional and metabolic diseases: Secondary | ICD-10-CM

## 2019-06-17 LAB — CBC WITH DIFFERENTIAL/PLATELET
Basophils Absolute: 0 10*3/uL (ref 0.0–0.2)
Basos: 0 %
EOS (ABSOLUTE): 0 10*3/uL (ref 0.0–0.4)
Eos: 0 %
Hematocrit: 37.9 % (ref 34.0–46.6)
Hemoglobin: 12.6 g/dL (ref 11.1–15.9)
Immature Grans (Abs): 0 10*3/uL (ref 0.0–0.1)
Immature Granulocytes: 0 %
Lymphocytes Absolute: 1.6 10*3/uL (ref 0.7–3.1)
Lymphs: 22 %
MCH: 31.2 pg (ref 26.6–33.0)
MCHC: 33.2 g/dL (ref 31.5–35.7)
MCV: 94 fL (ref 79–97)
Monocytes Absolute: 0.4 10*3/uL (ref 0.1–0.9)
Monocytes: 6 %
Neutrophils Absolute: 5.3 10*3/uL (ref 1.4–7.0)
Neutrophils: 72 %
Platelets: 212 10*3/uL (ref 150–450)
RBC: 4.04 x10E6/uL (ref 3.77–5.28)
RDW: 12.6 % (ref 11.7–15.4)
WBC: 7.4 10*3/uL (ref 3.4–10.8)

## 2019-06-17 LAB — COMPREHENSIVE METABOLIC PANEL
ALT: 18 IU/L (ref 0–32)
AST: 21 IU/L (ref 0–40)
Albumin/Globulin Ratio: 2.1 (ref 1.2–2.2)
Albumin: 4.5 g/dL (ref 3.8–4.8)
Alkaline Phosphatase: 121 IU/L — ABNORMAL HIGH (ref 39–117)
BUN/Creatinine Ratio: 11 (ref 9–23)
BUN: 11 mg/dL (ref 6–20)
Bilirubin Total: 0.3 mg/dL (ref 0.0–1.2)
CO2: 21 mmol/L (ref 20–29)
Calcium: 9.7 mg/dL (ref 8.7–10.2)
Chloride: 102 mmol/L (ref 96–106)
Creatinine, Ser: 0.98 mg/dL (ref 0.57–1.00)
GFR calc Af Amer: 89 mL/min/{1.73_m2} (ref >59)
GFR calc non Af Amer: 77 mL/min/{1.73_m2} (ref >59)
Globulin, Total: 2.1 g/dL (ref 1.5–4.5)
Glucose: 85 mg/dL (ref 65–99)
Potassium: 4.3 mmol/L (ref 3.5–5.2)
Sodium: 139 mmol/L (ref 134–144)
Total Protein: 6.6 g/dL (ref 6.0–8.5)

## 2019-06-17 LAB — TSH: TSH: 1.14 u[IU]/mL (ref 0.450–4.500)

## 2019-06-17 LAB — T4, FREE: Free T4: 1.36 ng/dL (ref 0.82–1.77)

## 2019-06-17 LAB — BETA HCG QUANT (REF LAB): hCG Quant: <1 m[IU]/mL

## 2019-06-23 ENCOUNTER — Ambulatory Visit: Payer: Medicaid Other | Attending: Internal Medicine

## 2019-06-23 DIAGNOSIS — Z23 Encounter for immunization: Secondary | ICD-10-CM

## 2019-06-23 NOTE — Progress Notes (Signed)
   Covid-19 Vaccination Clinic  Name:  Selena Malone    MRN: [id number] DOB: 1990-07-25  06/23/2019  [name] was observed post Covid-19 immunization for 15 minutes without incident. She was provided with Vaccine Information Sheet and instruction to access the V-Safe system.   [name] was instructed to call 911 with any severe reactions post vaccine: Marland Kitchen Difficulty breathing  . Swelling of face and throat  . A fast heartbeat  . A bad rash all over body  . Dizziness and weakness   Immunizations Administered    Name Date Dose VIS Date Route   Pfizer COVID-19 Vaccine 06/23/2019 12:37 PM 0.3 mL 04/28/2018 Intramuscular   Manufacturer: [employer], [employer]   Lot: [id number]   NDC: [address]-4

## 2019-08-27 ENCOUNTER — Ambulatory Visit (INDEPENDENT_AMBULATORY_CARE_PROVIDER_SITE_OTHER): Payer: Medicaid Other

## 2019-08-27 ENCOUNTER — Ambulatory Visit: Payer: Self-pay

## 2019-08-27 VITALS — BP 121/75 | HR 90 | Wt 213.5 lb

## 2019-08-27 DIAGNOSIS — Z3A10 10 weeks gestation of pregnancy: Secondary | ICD-10-CM | POA: Diagnosis not present

## 2019-08-27 DIAGNOSIS — O3680X0 Pregnancy with inconclusive fetal viability, not applicable or unspecified: Secondary | ICD-10-CM | POA: Diagnosis not present

## 2019-08-27 DIAGNOSIS — Z3490 Encounter for supervision of normal pregnancy, unspecified, unspecified trimester: Secondary | ICD-10-CM | POA: Insufficient documentation

## 2019-08-27 DIAGNOSIS — Z3491 Encounter for supervision of normal pregnancy, unspecified, first trimester: Secondary | ICD-10-CM

## 2019-08-27 MED ORDER — BLOOD PRESSURE KIT DEVI: 1.0000 | 0 refills | Status: DC

## 2019-08-27 NOTE — Progress Notes (Signed)
PRENATAL INTAKE SUMMARY  Ms. Pollitt presents today New OB Nurse Interview.  OB History    Gravida  4   Para  2   Term  2   Preterm  0   AB  1   Living  2     SAB  0   TAB  1   Ectopic  0   Multiple  0   Live Births  2          I have reviewed the patient's medical, obstetrical, social, and family histories, medications, and available lab results.  SUBJECTIVE She has no unusual complaints  OBJECTIVE Initial Physical Exam (New OB)  GENERAL APPEARANCE: alert, well appearing   ASSESSMENT Normal pregnancy  PHQ9 score: 3  FHR 165

## 2019-09-03 ENCOUNTER — Other Ambulatory Visit: Payer: Self-pay

## 2019-09-03 ENCOUNTER — Other Ambulatory Visit (HOSPITAL_COMMUNITY)
Admission: RE | Admit: 2019-09-03 | Discharge: 2019-09-03 | Disposition: A | Discharge status: Home / Self Care | Payer: Medicaid Other | Source: Ambulatory Visit | Attending: Women's Health | Admitting: Women's Health

## 2019-09-03 ENCOUNTER — Ambulatory Visit (INDEPENDENT_AMBULATORY_CARE_PROVIDER_SITE_OTHER): Payer: Medicaid Other | Admitting: Women's Health

## 2019-09-03 ENCOUNTER — Encounter: Payer: Self-pay | Admitting: Women's Health

## 2019-09-03 VITALS — BP 115/68 | HR 91 | Wt 214.0 lb

## 2019-09-03 DIAGNOSIS — O99211 Obesity complicating pregnancy, first trimester: Secondary | ICD-10-CM

## 2019-09-03 DIAGNOSIS — Z3490 Encounter for supervision of normal pregnancy, unspecified, unspecified trimester: Secondary | ICD-10-CM

## 2019-09-03 DIAGNOSIS — F419 Anxiety disorder, unspecified: Secondary | ICD-10-CM | POA: Diagnosis not present

## 2019-09-03 DIAGNOSIS — O99341 Other mental disorders complicating pregnancy, first trimester: Secondary | ICD-10-CM | POA: Diagnosis not present

## 2019-09-03 DIAGNOSIS — F32A Depression, unspecified: Secondary | ICD-10-CM

## 2019-09-03 DIAGNOSIS — F329 Major depressive disorder, single episode, unspecified: Secondary | ICD-10-CM

## 2019-09-03 DIAGNOSIS — O99891 Other specified diseases and conditions complicating pregnancy: Secondary | ICD-10-CM | POA: Diagnosis not present

## 2019-09-03 DIAGNOSIS — Z6835 Body mass index (BMI) 35.0-35.9, adult: Secondary | ICD-10-CM

## 2019-09-03 DIAGNOSIS — R519 Headache, unspecified: Secondary | ICD-10-CM

## 2019-09-03 DIAGNOSIS — Z3A11 11 weeks gestation of pregnancy: Secondary | ICD-10-CM

## 2019-09-03 DIAGNOSIS — E669 Obesity, unspecified: Secondary | ICD-10-CM

## 2019-09-03 DIAGNOSIS — G8929 Other chronic pain: Secondary | ICD-10-CM

## 2019-09-03 MED ORDER — ASPIRIN EC 81 MG PO TBEC: 81.0000 mg | DELAYED_RELEASE_TABLET | Freq: Every day | ORAL | 11 refills | Status: DC

## 2019-09-03 NOTE — Progress Notes (Signed)
NOB in office, intake interview and U/S completed on 08-27-19. Pt states that she had pap at [facility]in jan 2021

## 2019-09-03 NOTE — Progress Notes (Signed)
History:   Selena Malone is a 29 y.o. [id number] at 100w1dby early ultrasound being seen today for her first obstetrical visit.  Her obstetrical history is significant for obesity. Patient does not intend to breast feed. Pregnancy history fully reviewed.  Pt reports this is a desired and planned pregnancy. Allergies: Codeine (itching) Current Medications: PNVs, VitD, stool softener (name unknown) PMH: migraines, anxiety, depression. No HTN, DM, asthma. PSH: tonsils/adenoids removed 1995 OB Hx: 2008 (TAB), 2010 (full-term, NSVD), 2019 (full-term, IOL for post-dates, NSVD) Social Hx: pt does not smoke, drink, or use drugs. Family Hx: skull issues  Patient reports no complaints.      HISTORY: OB History  Gravida Para Term Preterm AB Living  _0 0 1 2  SAB TAB Ectopic Multiple Live Births  0 1 0 0 2    # Outcome Date GA Lbr Len/2nd Weight Sex Delivery Anes PTL Lv  4 Current           3 Term 02/27/18 462w2d9:38 / 00:09 8 lb 1.1 oz (3.66 kg) F Vag-Spont EPI  LIV     Name: Grosser,GIRL Shamon     Apgar1: 8  Apgar5: 9  2 Term 07/20/08 4021w0d lb 13 oz (3.544 kg) M Vag-Spont   LIV  1 TAB 07/2006            Last pap smear was done 03/2019 and was normal  Past Medical History:  Diagnosis Date  . Anxiety and depression   . Chronic headaches   . Iron deficiency anemia    Past Surgical History:  Procedure Laterality Date  . TONSILLECTOMY     Family History  Problem Relation Age of Onset  . Graves' disease Mother   . Migraines Mother   . Cancer Paternal Aunt   . Thyroid disease Paternal Aunt   . Cancer Maternal Grandmother   . Depression Maternal Grandfather   . Cancer Paternal Grandfather    Social History   Tobacco Use  . Smoking status: Former Smoker    Packs/day: 0.40    Years: 10.00    Pack years: 4.00    Quit date: 03/23/2017    Years since quitting: 2.4  . Smokeless tobacco: Never Used  Vaping Use  . Vaping Use: Never used  Substance Use  Topics  . Alcohol use: Not Currently  . Drug use: Not Currently   Allergies  Allergen Reactions  . Codeine Itching   Current Outpatient Medications on File Prior to Visit  Medication Sig Dispense Refill  . Blood Pressure Monitoring (BLOOD PRESSURE KIT) DEVI 1 kit by Does not apply route once a week. Check Blood Pressure regularly and record readings into the Babyscripts App.  Large Cuff.  DX O90.0 1 each 0  . Homeopathic Products (ALLERGY MEDICINE PO) Take by mouth.    . Multiple Vitamin (MULTIVITAMIN) tablet Take 1 tablet by mouth daily.     No current facility-administered medications on file prior to visit.    Review of Systems Pertinent items noted in HPI and remainder of comprehensive ROS otherwise negative. Physical Exam:   Vitals:   09/03/19 0940  BP: 115/68  Pulse: 91  Weight: 214 lb (97.1 kg)   Fetal Heart Rate (bpm): 164 Uterus:     Pelvic Exam: Perineum: no hemorrhoids, normal perineum   Vulva: normal external genitalia, no lesions   Vagina:  normal mucosa, normal discharge   Cervix: no lesions and normal, pap smear done.    Adnexa:  normal adnexa and no mass, fullness, tenderness   Bony Pelvis: average  System: General: well-developed, well-nourished female in no acute distress   Breasts:  normal appearance, no masses or tenderness bilaterally   Skin: normal coloration and turgor, no rashes   Neurologic: oriented, normal, negative, normal mood   Extremities: normal strength, tone, and muscle mass, ROM of all joints is normal   HEENT PERRLA, extraocular movement intact and sclera clear, anicteric   Mouth/Teeth mucous membranes moist, pharynx normal without lesions and dental hygiene good   Neck supple and no masses   Cardiovascular: regular rate and rhythm   Respiratory:  no respiratory distress, normal breath sounds   Abdomen: soft, non-tender; bowel sounds normal; no masses,  no organomegaly    Assessment:    Pregnancy: [id number] Patient Active Problem List    Diagnosis Date Noted  . Encounter for supervision of normal pregnancy, unspecified, unspecified trimester 08/27/2019  . Family history of thyroid disease in mother 06/16/2019  . Elevated alkaline phosphatase level 05/19/2019  . Chronic nonintractable headache 05/19/2019  . Anxiety and depression 05/19/2019  . Right lower quadrant abdominal pain 05/19/2019  . Low blood hemoglobin A2 08/11/2017     Plan:    1. Encounter for supervision of normal pregnancy, antepartum, unspecified gravidity - Cervicovaginal ancillary only( [facility]) - Culture, OB Urine - Obstetric Panel, Including HIV - Hepatitis C Antibody - Genetic Screening - Comp Met (CMET) - [place] MFM OB COMP + 14 WK; Future  2. Chronic nonintractable headache, unspecified headache type - AMB referral to headache clinic  3. Anxiety and depression - Ambulatory referral to [facility]  4. BMI 35.0-35.9,adult - aspirin EC 81 MG tablet; Take 1 tablet (81 mg total) by mouth daily. Swallow whole.  Dispense: 30 tablet; Refill: 11   Initial labs drawn. Continue prenatal vitamins. Problem list reviewed and updated. Genetic Screening discussed, NIPS: ordered. Ultrasound discussed; fetal anatomic survey: ordered. Anticipatory guidance about prenatal visits given including labs, ultrasounds, and testing. Discussed usage of Babyscripts and virtual visits as additional source of managing and completing prenatal visits in midst of coronavirus and pandemic.   Encouraged to complete MyChart Registration for her ability to review results, send requests, and have questions addressed.  The nature of [place] for [facility] with multiple MDs and Advanced Practice Providers was explained to patient; also emphasized that residents, students are part of our team. Routine obstetric precautions reviewed. Encouraged to seek out care at office or emergency room [facility] preferred) for urgent  and/or emergent concerns. Return in about 4 weeks (around 10/01/2019) for in-preson LOB/APP OK/AFP, needs anatomy US, referral to HA clinic, referral to [facility].   Clarisa Fling, NP  1:57 PM 09/04/2019

## 2019-09-03 NOTE — Progress Notes (Signed)
Duplicate note opened by Epic.

## 2019-09-03 NOTE — Patient Instructions (Addendum)
Maternity Assessment Unit (MAU)  The Maternity Assessment Unit (MAU) is located at the [facility]. The address is: [address] [place], [place], [place] [address]. Please see map below for additional directions.    The Maternity Assessment Unit is designed to help you during your pregnancy, and for up to 6 weeks after delivery, with any pregnancy- or postpartum-related emergencies, if you think you are in labor, or if your water has broken. For example, if you experience nausea and vomiting, vaginal bleeding, severe abdominal or pelvic pain, elevated blood pressure or other problems related to your pregnancy or postpartum time, please come to the Maternity Assessment Unit for assistance.     First Trimester of Pregnancy The first trimester of pregnancy is from week 1 until the end of week 13 (months 1 through 3). A week after a sperm fertilizes an egg, the egg will implant on the wall of the uterus. This embryo will begin to develop into a baby. Genes from you and your partner will form the baby. The female genes will determine whether the baby will be a boy or a girl. At 6-8 weeks, the eyes and face will be formed, and the heartbeat can be seen on ultrasound. At the end of 12 weeks, all the baby's organs will be formed. Now that you are pregnant, you will want to do everything you can to have a healthy baby. Two of the most important things are to get good prenatal care and to follow your health care provider's instructions. Prenatal care is all the medical care you receive before the baby's birth. This care will help prevent, find, and treat any problems during the pregnancy and childbirth. Body changes during your first trimester Your body goes through many changes during pregnancy. The changes vary from woman to woman.  You may gain or lose a couple of pounds at first.  You may feel sick to your stomach (nauseous) and you may throw up (vomit).  If the vomiting is uncontrollable, call your health care provider.  You may tire easily.  You may develop headaches that can be relieved by medicines. All medicines should be approved by your health care provider.  You may urinate more often. Painful urination may mean you have a bladder infection.  You may develop heartburn as a result of your pregnancy.  You may develop constipation because certain hormones are causing the muscles that push stool through your intestines to slow down.  You may develop hemorrhoids or swollen veins (varicose veins).  Your breasts may begin to grow larger and become tender. Your nipples may stick out more, and the tissue that surrounds them (areola) may become darker.  Your gums may bleed and may be sensitive to brushing and flossing.  Dark spots or blotches (chloasma, mask of pregnancy) may develop on your face. This will likely fade after the baby is born.  Your menstrual periods will stop.  You may have a loss of appetite.  You may develop cravings for certain kinds of food.  You may have changes in your emotions from day to day, such as being excited to be pregnant or being concerned that something may go wrong with the pregnancy and baby.  You may have more vivid and strange dreams.  You may have changes in your hair. These can include thickening of your hair, rapid growth, and changes in texture. Some women also have hair loss during or after pregnancy, or hair that  feels dry or thin. Your hair will most likely return to normal after your baby is born. What to expect at prenatal visits During a routine prenatal visit:  You will be weighed to make sure you and the baby are growing normally.  Your blood pressure will be taken.  Your abdomen will be measured to track your baby's growth.  The fetal heartbeat will be listened to between weeks 10 and 14 of your pregnancy.  Test results from any previous visits will be discussed. Your health  care provider may ask you:  How you are feeling.  If you are feeling the baby move.  If you have had any abnormal symptoms, such as leaking fluid, bleeding, severe headaches, or abdominal cramping.  If you are using any tobacco products, including cigarettes, chewing tobacco, and electronic cigarettes.  If you have any questions. Other tests that may be performed during your first trimester include:  Blood tests to find your blood type and to check for the presence of any previous infections. The tests will also be used to check for low iron levels (anemia) and protein on red blood cells (Rh antibodies). Depending on your risk factors, or if you previously had diabetes during pregnancy, you may have tests to check for high blood sugar that affects pregnant women (gestational diabetes).  Urine tests to check for infections, diabetes, or protein in the urine.  An ultrasound to confirm the proper growth and development of the baby.  Fetal screens for spinal cord problems (spina bifida) and Down syndrome.  HIV (human immunodeficiency virus) testing. Routine prenatal testing includes screening for HIV, unless you choose not to have this test.  You may need other tests to make sure you and the baby are doing well. Follow these instructions at home: Medicines  Follow your health care provider's instructions regarding medicine use. Specific medicines may be either safe or unsafe to take during pregnancy.  Take a prenatal vitamin that contains at least 600 micrograms (mcg) of folic acid.  If you develop constipation, try taking a stool softener if your health care provider approves. Eating and drinking   Eat a balanced diet that includes fresh fruits and vegetables, whole grains, good sources of protein such as meat, eggs, or tofu, and low-fat dairy. Your health care provider will help you determine the amount of weight gain that is right for you.  Avoid raw meat and uncooked cheese.  These carry germs that can cause birth defects in the baby.  Eating four or five small meals rather than three large meals a day may help relieve nausea and vomiting. If you start to feel nauseous, eating a few soda crackers can be helpful. Drinking liquids between meals, instead of during meals, also seems to help ease nausea and vomiting.  Limit foods that are high in fat and processed sugars, such as fried and sweet foods.  To prevent constipation: ? Eat foods that are high in fiber, such as fresh fruits and vegetables, whole grains, and beans. ? Drink enough fluid to keep your urine clear or pale yellow. Activity  Exercise only as directed by your health care provider. Most women can continue their usual exercise routine during pregnancy. Try to exercise for 30 minutes at least 5 days a week. Exercising will help you: ? Control your weight. ? Stay in shape. ? Be prepared for labor and delivery.  Experiencing pain or cramping in the lower abdomen or lower back is a good sign that you should stop  exercising. Check with your health care provider before continuing with normal exercises.  Try to avoid standing for long periods of time. Move your legs often if you must stand in one place for a long time.  Avoid heavy lifting.  Wear low-heeled shoes and practice good posture.  You may continue to have sex unless your health care provider tells you not to. Relieving pain and discomfort  Wear a good support bra to relieve breast tenderness.  Take warm sitz baths to soothe any pain or discomfort caused by hemorrhoids. Use hemorrhoid cream if your health care provider approves.  Rest with your legs elevated if you have leg cramps or low back pain.  If you develop varicose veins in your legs, wear support hose. Elevate your feet for 15 minutes, 3-4 times a day. Limit salt in your diet. Prenatal care  Schedule your prenatal visits by the twelfth week of pregnancy. They are usually  scheduled monthly at first, then more often in the last 2 months before delivery.  Write down your questions. Take them to your prenatal visits.  Keep all your prenatal visits as told by your health care provider. This is important. Safety  Wear your seat belt at all times when driving.  Make a list of emergency phone numbers, including numbers for family, friends, the hospital, and police and fire departments. General instructions  Ask your health care provider for a referral to a local prenatal education class. Begin classes no later than the beginning of month 6 of your pregnancy.  Ask for help if you have counseling or nutritional needs during pregnancy. Your health care provider can offer advice or refer you to specialists for help with various needs.  Do not use hot tubs, steam rooms, or saunas.  Do not douche or use tampons or scented sanitary pads.  Do not cross your legs for long periods of time.  Avoid cat litter boxes and soil used by cats. These carry germs that can cause birth defects in the baby and possibly loss of the fetus by miscarriage or stillbirth.  Avoid all smoking, herbs, alcohol, and medicines not prescribed by your health care provider. Chemicals in these products affect the formation and growth of the baby.  Do not use any products that contain nicotine or tobacco, such as cigarettes and e-cigarettes. If you need help quitting, ask your health care provider. You may receive counseling support and other resources to help you quit.  Schedule a dentist appointment. At home, brush your teeth with a soft toothbrush and be gentle when you floss. Contact a health care provider if:  You have dizziness.  You have mild pelvic cramps, pelvic pressure, or nagging pain in the abdominal area.  You have persistent nausea, vomiting, or diarrhea.  You have a bad smelling vaginal discharge.  You have pain when you urinate.  You notice increased swelling in your face,  hands, legs, or ankles.  You are exposed to fifth disease or chickenpox.  You are exposed to [place] measles (rubella) and have never had it. Get help right away if:  You have a fever.  You are leaking fluid from your vagina.  You have spotting or bleeding from your vagina.  You have severe abdominal cramping or pain.  You have rapid weight gain or loss.  You vomit blood or material that looks like coffee grounds.  You develop a severe headache.  You have shortness of breath.  You have any kind of trauma, such as from  a fall or a car accident. Summary  The first trimester of pregnancy is from week 1 until the end of week 13 (months 1 through 3).  Your body goes through many changes during pregnancy. The changes vary from woman to woman.  You will have routine prenatal visits. During those visits, your health care provider will examine you, discuss any test results you may have, and talk with you about how you are feeling. This information is not intended to replace advice given to you by your health care provider. Make sure you discuss any questions you have with your health care provider. Document Revised: 01/31/2017 Document Reviewed: 01/31/2016 Elsevier Patient Education  2020 [employer].       Second Trimester of Pregnancy The second trimester is from week 14 through week 27 (months 4 through 6). The second trimester is often a time when you feel your best. Your body has adjusted to being pregnant, and you begin to feel better physically. Usually, morning sickness has lessened or quit completely, you may have more energy, and you may have an increase in appetite. The second trimester is also a time when the fetus is growing rapidly. At the end of the sixth month, the fetus is about 9 inches long and weighs about 1 pounds. You will likely begin to feel the baby move (quickening) between 16 and 20 weeks of pregnancy. Body changes during your second trimester Your body  continues to go through many changes during your second trimester. The changes vary from woman to woman.  Your weight will continue to increase. You will notice your lower abdomen bulging out.  You may begin to get stretch marks on your hips, abdomen, and breasts.  You may develop headaches that can be relieved by medicines. The medicines should be approved by your health care provider.  You may urinate more often because the fetus is pressing on your bladder.  You may develop or continue to have heartburn as a result of your pregnancy.  You may develop constipation because certain hormones are causing the muscles that push waste through your intestines to slow down.  You may develop hemorrhoids or swollen, bulging veins (varicose veins).  You may have back pain. This is caused by: ? Weight gain. ? Pregnancy hormones that are relaxing the joints in your pelvis. ? A shift in weight and the muscles that support your balance.  Your breasts will continue to grow and they will continue to become tender.  Your gums may bleed and may be sensitive to brushing and flossing.  Dark spots or blotches (chloasma, mask of pregnancy) may develop on your face. This will likely fade after the baby is born.  A dark line from your belly button to the pubic area (linea nigra) may appear. This will likely fade after the baby is born.  You may have changes in your hair. These can include thickening of your hair, rapid growth, and changes in texture. Some women also have hair loss during or after pregnancy, or hair that feels dry or thin. Your hair will most likely return to normal after your baby is born. What to expect at prenatal visits During a routine prenatal visit:  You will be weighed to make sure you and the fetus are growing normally.  Your blood pressure will be taken.  Your abdomen will be measured to track your baby's growth.  The fetal heartbeat will be listened to.  Any test results  from the previous visit will be  discussed. Your health care provider may ask you:  How you are feeling.  If you are feeling the baby move.  If you have had any abnormal symptoms, such as leaking fluid, bleeding, severe headaches, or abdominal cramping.  If you are using any tobacco products, including cigarettes, chewing tobacco, and electronic cigarettes.  If you have any questions. Other tests that may be performed during your second trimester include:  Blood tests that check for: ? Low iron levels (anemia). ? High blood sugar that affects pregnant women (gestational diabetes) between 109 and 28 weeks. ? Rh antibodies. This is to check for a protein on red blood cells (Rh factor).  Urine tests to check for infections, diabetes, or protein in the urine.  An ultrasound to confirm the proper growth and development of the baby.  An amniocentesis to check for possible genetic problems.  Fetal screens for spina bifida and Down syndrome.  HIV (human immunodeficiency virus) testing. Routine prenatal testing includes screening for HIV, unless you choose not to have this test. Follow these instructions at home: Medicines  Follow your health care provider's instructions regarding medicine use. Specific medicines may be either safe or unsafe to take during pregnancy.  Take a prenatal vitamin that contains at least 600 micrograms (mcg) of folic acid.  If you develop constipation, try taking a stool softener if your health care provider approves. Eating and drinking   Eat a balanced diet that includes fresh fruits and vegetables, whole grains, good sources of protein such as meat, eggs, or tofu, and low-fat dairy. Your health care provider will help you determine the amount of weight gain that is right for you.  Avoid raw meat and uncooked cheese. These carry germs that can cause birth defects in the baby.  If you have low calcium intake from food, talk to your health care provider  about whether you should take a daily calcium supplement.  Limit foods that are high in fat and processed sugars, such as fried and sweet foods.  To prevent constipation: ? Drink enough fluid to keep your urine clear or pale yellow. ? Eat foods that are high in fiber, such as fresh fruits and vegetables, whole grains, and beans. Activity  Exercise only as directed by your health care provider. Most women can continue their usual exercise routine during pregnancy. Try to exercise for 30 minutes at least 5 days a week. Stop exercising if you experience uterine contractions.  Avoid heavy lifting, wear low heel shoes, and practice good posture.  A sexual relationship may be continued unless your health care provider directs you otherwise. Relieving pain and discomfort  Wear a good support bra to prevent discomfort from breast tenderness.  Take warm sitz baths to soothe any pain or discomfort caused by hemorrhoids. Use hemorrhoid cream if your health care provider approves.  Rest with your legs elevated if you have leg cramps or low back pain.  If you develop varicose veins, wear support hose. Elevate your feet for 15 minutes, 3-4 times a day. Limit salt in your diet. Prenatal Care  Write down your questions. Take them to your prenatal visits.  Keep all your prenatal visits as told by your health care provider. This is important. Safety  Wear your seat belt at all times when driving.  Make a list of emergency phone numbers, including numbers for family, friends, the hospital, and police and fire departments. General instructions  Ask your health care provider for a referral to a local prenatal education  class. Begin classes no later than the beginning of month 6 of your pregnancy.  Ask for help if you have counseling or nutritional needs during pregnancy. Your health care provider can offer advice or refer you to specialists for help with various needs.  Do not use hot tubs, steam  rooms, or saunas.  Do not douche or use tampons or scented sanitary pads.  Do not cross your legs for long periods of time.  Avoid cat litter boxes and soil used by cats. These carry germs that can cause birth defects in the baby and possibly loss of the fetus by miscarriage or stillbirth.  Avoid all smoking, herbs, alcohol, and unprescribed drugs. Chemicals in these products can affect the formation and growth of the baby.  Do not use any products that contain nicotine or tobacco, such as cigarettes and e-cigarettes. If you need help quitting, ask your health care provider.  Visit your dentist if you have not gone yet during your pregnancy. Use a soft toothbrush to brush your teeth and be gentle when you floss. Contact a health care provider if:  You have dizziness.  You have mild pelvic cramps, pelvic pressure, or nagging pain in the abdominal area.  You have persistent nausea, vomiting, or diarrhea.  You have a bad smelling vaginal discharge.  You have pain when you urinate. Get help right away if:  You have a fever.  You are leaking fluid from your vagina.  You have spotting or bleeding from your vagina.  You have severe abdominal cramping or pain.  You have rapid weight gain or weight loss.  You have shortness of breath with chest pain.  You notice sudden or extreme swelling of your face, hands, ankles, feet, or legs.  You have not felt your baby move in over an hour.  You have severe headaches that do not go away when you take medicine.  You have vision changes. Summary  The second trimester is from week 14 through week 27 (months 4 through 6). It is also a time when the fetus is growing rapidly.  Your body goes through many changes during pregnancy. The changes vary from woman to woman.  Avoid all smoking, herbs, alcohol, and unprescribed drugs. These chemicals affect the formation and growth your baby.  Do not use any tobacco products, such as cigarettes,  chewing tobacco, and e-cigarettes. If you need help quitting, ask your health care provider.  Contact your health care provider if you have any questions. Keep all prenatal visits as told by your health care provider. This is important. This information is not intended to replace advice given to you by your health care provider. Make sure you discuss any questions you have with your health care provider. Document Revised: 06/12/2018 Document Reviewed: 03/26/2016 Elsevier Patient Education  2020 Else[employer] Inc.         Round Ligament Pain  The round ligament is a cord of muscle and tissue that helps support the uterus. It can become a source of pain during pregnancy if it becomes stretched or twisted as the baby grows. The pain usually begins in the second trimester (13-28 weeks) of pregnancy, and it can come and go until the baby is delivered. It is not a serious problem, and it does not cause harm to the baby. Round ligament pain is usually a short, sharp, and pinching pain, but it can also be a dull, lingering, and aching pain. The pain is felt in the lower side of the abdomen  or in the groin. It usually starts deep in the groin and moves up to the outside of the hip area. The pain may occur when you:  Suddenly change position, such as quickly going from a sitting to standing position.  Roll over in bed.  Cough or sneeze.  Do physical activity. Follow these instructions at home:   Watch your condition for any changes.  When the pain starts, relax. Then try any of these methods to help with the pain: ? Sitting down. ? Flexing your knees up to your abdomen. ? Lying on your side with one pillow under your abdomen and another pillow between your legs. ? Sitting in a warm bath for 15-20 minutes or until the pain goes away.  Take over-the-counter and prescription medicines only as told by your health care provider.  Move slowly when you sit down or stand up.  Avoid long walks if  they cause pain.  Stop or reduce your physical activities if they cause pain.  Keep all follow-up visits as told by your health care provider. This is important. Contact a health care provider if:  Your pain does not go away with treatment.  You feel pain in your back that you did not have before.  Your medicine is not helping. Get help right away if:  You have a fever or chills.  You develop uterine contractions.  You have vaginal bleeding.  You have nausea or vomiting.  You have diarrhea.  You have pain when you urinate. Summary  Round ligament pain is felt in the lower abdomen or groin. It is usually a short, sharp, and pinching pain. It can also be a dull, lingering, and aching pain.  This pain usually begins in the second trimester (13-28 weeks). It occurs because the uterus is stretching with the growing baby, and it is not harmful to the baby.  You may notice the pain when you suddenly change position, when you cough or sneeze, or during physical activity.  Relaxing, flexing your knees to your abdomen, lying on one side, or taking a warm bath may help to get rid of the pain.  Get help from your health care provider if the pain does not go away or if you have vaginal bleeding, nausea, vomiting, diarrhea, or painful urination. This information is not intended to replace advice given to you by your health care provider. Make sure you discuss any questions you have with your health care provider. Document Revised: 08/06/2017 Document Reviewed: 08/06/2017 Elsevier Patient Education  2020 [employer].                      Safe Medications in Pregnancy    Acne: Benzoyl Peroxide Salicylic Acid  Backache/Headache: Tylenol: 2 regular strength every 4 hours OR              2 Extra strength every 6 hours  Colds/Coughs/Allergies: Benadryl (alcohol free) 25 mg every 6 hours as needed Breath right strips Claritin Cepacol throat lozenges Chloraseptic throat  spray Cold-Eeze- up to three times per day Cough drops, alcohol free Flonase (by prescription only) Guaifenesin Mucinex Robitussin DM (plain only, alcohol free) Saline nasal spray/drops Sudafed (pseudoephedrine) & Actifed ** use only after [redacted] weeks gestation and if you do not have high blood pressure Tylenol Vicks Vaporub Zinc lozenges Zyrtec   Constipation: Colace Ducolax suppositories Fleet enema Glycerin suppositories Metamucil Milk of magnesia Miralax Senokot Smooth move tea  Diarrhea: Kaopectate Imodium A-D  *NO pepto Bismol  Hemorrhoids: Anusol Anusol HC Preparation H Tucks  Indigestion: Tums Maalox Mylanta Zantac  Pepcid  Insomnia: Benadryl (alcohol free)  every 6 hours as needed Tylenol PM Unisom, no Gelcaps  Leg Cramps: Tums MagGel  Nausea/Vomiting:  Bonine Dramamine Emetrol Ginger extract Sea bands Meclizine  Nausea medication to take during pregnancy:  Unisom (doxylamine succinate 25 mg tablets) Take one tablet daily at bedtime. If symptoms are not adequately controlled, the dose can be increased to a maximum recommended dose of two tablets daily (1/2 tablet in the morning, 1/2 tablet mid-afternoon and one at bedtime). Vitamin B6  tablets. Take one tablet twice a day (up to 200 mg per day).  Skin Rashes: Aveeno products Benadryl cream or  every 6 hours as needed Calamine Lotion 1% cortisone cream  Yeast infection: Gyne-lotrimin 7 Monistat 7   **If taking multiple medications, please check labels to avoid duplicating the same active ingredients **take medication as directed on the label ** Do not exceed 4000 mg of tylenol in 24 hours **Do not take medications that contain aspirin or ibuprofen

## 2019-09-04 LAB — COMPREHENSIVE METABOLIC PANEL
ALT: 15 IU/L (ref 0–32)
AST: 14 IU/L (ref 0–40)
Albumin/Globulin Ratio: 2 (ref 1.2–2.2)
Albumin: 4.2 g/dL (ref 3.8–4.8)
Alkaline Phosphatase: 96 IU/L (ref 48–121)
BUN/Creatinine Ratio: 10 (ref 9–23)
BUN: 7 mg/dL (ref 6–20)
Bilirubin Total: <0.2 mg/dL (ref 0.0–1.2)
CO2: 21 mmol/L (ref 20–29)
Calcium: 9.4 mg/dL (ref 8.7–10.2)
Chloride: 104 mmol/L (ref 96–106)
Creatinine, Ser: 0.69 mg/dL (ref 0.57–1.00)
GFR calc Af Amer: 134 mL/min/{1.73_m2} (ref >59)
GFR calc non Af Amer: 116 mL/min/{1.73_m2} (ref >59)
Globulin, Total: 2.1 g/dL (ref 1.5–4.5)
Glucose: 88 mg/dL (ref 65–99)
Potassium: 4.3 mmol/L (ref 3.5–5.2)
Sodium: 139 mmol/L (ref 134–144)
Total Protein: 6.3 g/dL (ref 6.0–8.5)

## 2019-09-04 LAB — OBSTETRIC PANEL, INCLUDING HIV
Antibody Screen: NEGATIVE
Basophils Absolute: 0 10*3/uL (ref 0.0–0.2)
Basos: 0 %
EOS (ABSOLUTE): 0 10*3/uL (ref 0.0–0.4)
Eos: 0 %
HIV Screen 4th Generation wRfx: NONREACTIVE
Hematocrit: 35.5 % (ref 34.0–46.6)
Hemoglobin: 12.4 g/dL (ref 11.1–15.9)
Hepatitis B Surface Ag: NEGATIVE
Immature Grans (Abs): 0 10*3/uL (ref 0.0–0.1)
Immature Granulocytes: 0 %
Lymphocytes Absolute: 1.3 10*3/uL (ref 0.7–3.1)
Lymphs: 23 %
MCH: 33.1 pg — ABNORMAL HIGH (ref 26.6–33.0)
MCHC: 34.9 g/dL (ref 31.5–35.7)
MCV: 95 fL (ref 79–97)
Monocytes Absolute: 0.2 10*3/uL (ref 0.1–0.9)
Monocytes: 4 %
Neutrophils Absolute: 4 10*3/uL (ref 1.4–7.0)
Neutrophils: 73 %
Platelets: 192 10*3/uL (ref 150–450)
RBC: 3.75 x10E6/uL — ABNORMAL LOW (ref 3.77–5.28)
RDW: 12.6 % (ref 11.7–15.4)
RPR Ser Ql: NONREACTIVE
Rh Factor: POSITIVE
Rubella Antibodies, IGG: 1.54 index (ref >0.99)
WBC: 5.6 10*3/uL (ref 3.4–10.8)

## 2019-09-04 LAB — HEPATITIS C ANTIBODY: Hep C Virus Ab: <0.1 s/co ratio (ref 0.0–0.9)

## 2019-09-05 LAB — URINE CULTURE, OB REFLEX

## 2019-09-05 LAB — CULTURE, OB URINE

## 2019-09-07 LAB — CERVICOVAGINAL ANCILLARY ONLY
Bacterial Vaginitis (gardnerella): NEGATIVE
Candida Glabrata: NEGATIVE
Candida Vaginitis: NEGATIVE
Chlamydia: NEGATIVE
Comment: NEGATIVE
Comment: NEGATIVE
Comment: NEGATIVE
Comment: NEGATIVE
Comment: NEGATIVE
Comment: NORMAL
Neisseria Gonorrhea: NEGATIVE
Trichomonas: NEGATIVE

## 2019-09-10 ENCOUNTER — Encounter: Payer: Self-pay | Admitting: Women's Health

## 2019-09-13 ENCOUNTER — Encounter: Payer: Self-pay | Admitting: Women's Health

## 2019-09-17 ENCOUNTER — Ambulatory Visit (INDEPENDENT_AMBULATORY_CARE_PROVIDER_SITE_OTHER): Payer: Medicaid Other | Admitting: Physician Assistant

## 2019-09-17 ENCOUNTER — Encounter: Payer: Self-pay | Admitting: Physician Assistant

## 2019-09-17 ENCOUNTER — Other Ambulatory Visit: Payer: Self-pay

## 2019-09-17 VITALS — BP 100/70 | HR 92 | Ht 64.0 in | Wt 210.0 lb

## 2019-09-17 DIAGNOSIS — O99350 Diseases of the nervous system complicating pregnancy, unspecified trimester: Secondary | ICD-10-CM

## 2019-09-17 DIAGNOSIS — O26899 Other specified pregnancy related conditions, unspecified trimester: Secondary | ICD-10-CM

## 2019-09-17 DIAGNOSIS — Z3A13 13 weeks gestation of pregnancy: Secondary | ICD-10-CM

## 2019-09-17 DIAGNOSIS — R42 Dizziness and giddiness: Secondary | ICD-10-CM | POA: Diagnosis not present

## 2019-09-17 DIAGNOSIS — G43009 Migraine without aura, not intractable, without status migrainosus: Secondary | ICD-10-CM

## 2019-09-17 DIAGNOSIS — O99891 Other specified diseases and conditions complicating pregnancy: Secondary | ICD-10-CM

## 2019-09-17 DIAGNOSIS — M62838 Other muscle spasm: Secondary | ICD-10-CM

## 2019-09-17 MED ORDER — METOCLOPRAMIDE HCL 10 MG PO TABS
10.0000 mg | ORAL_TABLET | Freq: Three times a day (TID) | ORAL | 1 refills | Status: DC | PRN
Start: 2019-09-17 — End: 2020-02-24

## 2019-09-17 MED ORDER — CYCLOBENZAPRINE HCL 10 MG PO TABS
10.0000 mg | ORAL_TABLET | Freq: Three times a day (TID) | ORAL | 1 refills | Status: DC | PRN
Start: 2019-09-17 — End: 2020-02-24

## 2019-09-17 NOTE — Progress Notes (Signed)
History:  Selena Malone is a 29 y.o. [id number] who presents to clinic today for headaches.  She has had them her entire life.  They can be severe, lasting up to 3-4 days.    There is throbbing.  Moving sometimes makes it better and lying down makes it worse.  Some headaches frontal lobe and top, some behind left eye and temple.  There is nausea, no vomiting.  Lights bother her, as well as noises.  There is associated vertigo occasionally.  She denies warning symptoms. Vertigo - started during prior pregnancy, she has currently had it present for more than one week.  She was previously told to use benadryl and sleep.  That has not been effective.    Uses Tylenol PM, benadryl, sleep, peppermint, hydration, sudafed, pickles  She has not seen medical provider for headaches  HIT6:64 Number of days in the last 4 weeks with:  Severe headache: 8 Moderate headache: 0 Mild headache: 10  No headache: 10   Past Medical History:  Diagnosis Date  . Anxiety and depression   . Chronic headaches   . Iron deficiency anemia     Social History   Socioeconomic History  . Marital status: Married    Spouse name: Not on file  . Number of children: Not on file  . Years of education: Not on file  . Highest education level: Not on file  Occupational History  . Not on file  Tobacco Use  . Smoking status: Former Smoker    Packs/day: 0.40    Years: 10.00    Pack years: 4.00    Quit date: 03/23/2017    Years since quitting: 2.4  . Smokeless tobacco: Never Used  Vaping Use  . Vaping Use: Never used  Substance and Sexual Activity  . Alcohol use: Not Currently  . Drug use: Not Currently  . Sexual activity: Yes    Partners: Male    Birth control/protection: None    Comment: 1st intercourse-13, partners- 7, married-3 yrs,   Other Topics Concern  . Not on file  Social History Narrative   ** Merged History Encounter **       Social Determinants of Health   Financial Resource Strain:   .  Difficulty of Paying Living Expenses:   Food Insecurity:   . Worried About Charity fundraiser in the Last Year:   . Arboriculturist in the Last Year:   Transportation Needs:   . Film/video editor (Medical):   Marland Kitchen Lack of Transportation (Non-Medical):   Physical Activity:   . Days of Exercise per Week:   . Minutes of Exercise per Session:   Stress:   . Feeling of Stress :   Social Connections:   . Frequency of Communication with Friends and Family:   . Frequency of Social Gatherings with Friends and Family:   . Attends Religious Services:   . Active Member of Clubs or Organizations:   . Attends Archivist Meetings:   Marland Kitchen Marital Status:   Intimate Partner Violence:   . Fear of Current or Ex-Partner:   . Emotionally Abused:   Marland Kitchen Physically Abused:   . Sexually Abused:     Family History  Problem Relation Age of Onset  . Graves' disease Mother   . Migraines Mother   . Cancer Paternal Aunt   . Thyroid disease Paternal Aunt   . Cancer Maternal Grandmother   . Depression Maternal Grandfather   . Cancer Paternal Grandfather  Allergies  Allergen Reactions  . Codeine Itching    Current Outpatient Medications on File Prior to Visit  Medication Sig Dispense Refill  . cholecalciferol (VITAMIN D3) 25 MCG (1000 UNIT) tablet Take 1,000 Units by mouth daily.    . Multiple Vitamin (MULTIVITAMIN) tablet Take 1 tablet by mouth daily.    Marland Kitchen aspirin EC 81 MG tablet Take 1 tablet (81 mg total) by mouth daily. Swallow whole. (Patient not taking: Reported on 09/17/2019) 30 tablet 11  . Blood Pressure Monitoring (BLOOD PRESSURE KIT) DEVI 1 kit by Does not apply route once a week. Check Blood Pressure regularly and record readings into the [employer] App.  Large Cuff.  DX O90.0 1 each 0  . Homeopathic Products (ALLERGY MEDICINE PO) Take by mouth. (Patient not taking: Reported on 09/17/2019)     No current facility-administered medications on file prior to visit.     Review of  Systems:  All pertinent positive/negative included in HPI, all other review of systems are negative   Objective:  Physical Exam BP 100/70   Pulse 92   Ht '5\' 4"'  (1.626 m)   Wt 210 lb (95.3 kg)   LMP 05/17/2019 (Approximate)   BMI 36.05 kg/m  CONSTITUTIONAL: Well-developed, well-nourished female in no acute distress.  EYES: EOM intact ENT: Normocephalic CARDIOVASCULAR: Regular rate  RESPIRATORY: Normal rate.  MUSCULOSKELETAL: Normal ROM, signficant muscle spasm noted of bilat neck and shoulders SKIN: Warm, dry without erythema  NEUROLOGICAL: Alert, oriented, CN II-XII grossly intact, Appropriate balance, No dysmetria, Sensation equal bilaterally, Romberg negative.   PSYCH: Normal behavior, mood  PROCEDURE:  TRIGGER POINT INJECTIONS  Procedure: Mixture of 1%  Lidocaine, marcaine and dexamethazone in a ratio of 2:2:1  injected with 1cc each site in bilateral greater Occipital Nerves, bilateral lesser occipital nerves, bilateral cervical paraspinal muscles, bilateral trapezius.  Total amount injected: 13cc.  Pt tolerated procedure well and indicated improvement in symptoms right away.    Assessment & Plan:  Assessment: 1. Migraine without aura and without status migrainosus, not intractable   2. Headache in pregnancy, unspecified trimester   3. Vertigo   4. Muscle spasm    New problems  Plan: Nerve blocks/TPI today Flexeril for headache/ up to TID, sedation precautions reviewed Reglan prn HA up to TID Self care: massage/chiropractor Sleep/eat/drink on regular schedule Follow-up  PRN  Paticia Stack, PA-C 09/17/2019 8:59 AM

## 2019-09-17 NOTE — Patient Instructions (Signed)

## 2019-09-20 ENCOUNTER — Ambulatory Visit (INDEPENDENT_AMBULATORY_CARE_PROVIDER_SITE_OTHER): Payer: Medicaid Other | Admitting: Licensed Clinical Social Worker

## 2019-09-20 DIAGNOSIS — Z3A00 Weeks of gestation of pregnancy not specified: Secondary | ICD-10-CM

## 2019-09-20 DIAGNOSIS — O99340 Other mental disorders complicating pregnancy, unspecified trimester: Secondary | ICD-10-CM

## 2019-09-20 DIAGNOSIS — F419 Anxiety disorder, unspecified: Secondary | ICD-10-CM

## 2019-09-20 DIAGNOSIS — F418 Other specified anxiety disorders: Secondary | ICD-10-CM | POA: Diagnosis not present

## 2019-09-21 NOTE — BH Specialist Note (Signed)
Integrated Behavioral Health via Telemedicine Video Public affairs consultant) Visit  09/21/2019 Selena Malone [id number]  Number of Integrated Behavioral Health visits: 1 Session Start time: 9:03am  Session End time: 9:40am Total time: 37 mins via mychart  Referring Provider: Farris Has NP Type of Visit: Video Patient/Family location: Home  [facility] Provider location: [facility]  All persons participating in visit: Selena Malone and Selena Malone   Confirmed patient's address: yes Confirmed patient's phone number: yes Any changes to demographics: no  Confirmed patient's insurance: no Any changes to patient's insurance: no  Discussed confidentiality: yes  I connected with Selena Malone and/or Selena Malone's  by a video enabled telemedicine application ([employer]) and verified that I am speaking with the correct person using two identifiers.     I discussed the limitations of evaluation and management by telemedicine and the availability of in person appointments.  I discussed that the purpose of this visit is to provide behavioral health care while limiting exposure to the novel coronavirus.   Discussed there is a possibility of technology failure and discussed alternative modes of communication if that failure occurs.  I discussed that engaging in this virtual visit, they consent to the provision of behavioral healthcare and the services will be billed under their insurance.  Patient and/or legal guardian expressed understanding and consented to virtual visit: yes  PRESENTING CONCERNS: Patient and/or family reports the following symptoms/concerns: depression, lack of support, overwhelmed,  Duration of problem: approx one month; Severity of problem: mild   STRENGTHS (Protective Factors/Coping Skills): Verbalizing her needs, managing parenting and family responsibilities   GOALS ADDRESSED: Patient will: 1.  Reduce symptoms of: anxiety  2.  Increase knowledge and/or  ability of: diagnosis and implement coping skills to alleviate symptoms   3.  Demonstrate ability to: self manage symptoms   INTERVENTIONS: Interventions utilized:  Supportive counseling  Standardized Assessments completed: 08/27/2019   Clinical Support [facility]27/2019 [facility]  PHQ-9 Total Score 3      ASSESSMENT: Patient currently experiencing anxiety affecting pregnancy    Patient may benefit from integrated behavioral health and family counseling   PLAN: 1. Follow up with behavioral health clinician on : 3 weeks via mychart  2. Behavioral recommendations: prioritize self care routine,  Implement mindfulness techniques discussed.  3. Referral(s): Married and family therapy to address issues with marriage.   I discussed the assessment and treatment plan with the patient and/or parent/guardian. They were provided an opportunity to ask questions and all were answered. They agreed with the plan and demonstrated an understanding of the instructions.   They were advised to call back or seek an in-person evaluation if the symptoms worsen or if the condition fails to improve as anticipated.  Selena Malone

## 2019-09-22 NOTE — Progress Notes (Signed)
Patient ID: Selena Malone, female   DOB: 1991/02/26, 29 y.o.   MRN: [id number] Patient was assessed and managed by nursing staff during this encounter. I have reviewed the chart and agree with the documentation and plan. I have also made any necessary editorial changes.  Scheryl Darter, MD 09/22/2019 1:17 PM

## 2019-09-29 ENCOUNTER — Ambulatory Visit (INDEPENDENT_AMBULATORY_CARE_PROVIDER_SITE_OTHER): Payer: Medicaid Other | Admitting: Obstetrics & Gynecology

## 2019-09-29 ENCOUNTER — Encounter: Payer: Self-pay | Admitting: Obstetrics & Gynecology

## 2019-09-29 ENCOUNTER — Other Ambulatory Visit: Payer: Self-pay

## 2019-09-29 ENCOUNTER — Encounter: Payer: Medicaid Other | Admitting: Advanced Practice Midwife

## 2019-09-29 VITALS — BP 113/66 | HR 109 | Wt 214.4 lb

## 2019-09-29 DIAGNOSIS — Z3482 Encounter for supervision of other normal pregnancy, second trimester: Secondary | ICD-10-CM

## 2019-09-29 DIAGNOSIS — R42 Dizziness and giddiness: Secondary | ICD-10-CM

## 2019-09-29 DIAGNOSIS — Z3A14 14 weeks gestation of pregnancy: Secondary | ICD-10-CM

## 2019-09-29 DIAGNOSIS — Z3480 Encounter for supervision of other normal pregnancy, unspecified trimester: Secondary | ICD-10-CM

## 2019-09-29 NOTE — Patient Instructions (Signed)

## 2019-09-29 NOTE — Progress Notes (Signed)
   PRENATAL VISIT NOTE  Subjective:  Selena Malone is a 29 y.o. H2C9470 at [redacted]w[redacted]d being seen today for ongoing prenatal care.  She is currently monitored for the following issues for this low-risk pregnancy and has Low blood hemoglobin A2; Elevated alkaline phosphatase level; Chronic nonintractable headache; Anxiety and depression; Right lower quadrant abdominal pain; Family history of thyroid disease in mother; and Encounter for supervision of normal pregnancy, unspecified, unspecified trimester on their problem list.  Patient reports vertigo that has continued, but somewhat ameliorated with her migraine medication.  Contractions: Not present.  .  Movement: Present. Denies leaking of fluid.   The following portions of the patient's history were reviewed and updated as appropriate: allergies, current medications, past family history, past medical history, past social history, past surgical history and problem list.   Objective:   Vitals:   09/29/19 1350  BP: 113/66  Pulse: (!) 109  Weight: (!) 214 lb 6.4 oz (97.3 kg)    Fetal Status: Fetal Heart Rate (bpm): 147   Movement: Present     General:  Alert, oriented and cooperative. Patient is in no acute distress.  Skin: Skin is warm and dry. No rash noted.   Cardiovascular: Normal heart rate noted  Respiratory: Normal respiratory effort, no problems with respiration noted  Abdomen: Soft, gravid, appropriate for gestational age.  Pain/Pressure: Absent     Pelvic: Cervical exam deferred        Extremities: Normal range of motion.     Mental Status: Normal mood and affect. Normal behavior. Normal judgment and thought content.   Assessment and Plan:  Pregnancy: [name] at [redacted]w[redacted]d 1. Vertigo Will continue migraine medication as this helps, will let us know if it gets worse  2. [redacted] weeks gestation of pregnancy 3. Supervision of other normal pregnancy, antepartum Low risk NIPS, already scheduled for anatomy scan. Counseled about AFP  only screen, will get at next visit.  No other complaints or concerns.  Routine obstetric precautions reviewed. Please refer to After Visit Summary for other counseling recommendations.   Return in about 4 weeks (around 10/27/2019) for OFFICE OB Visit and AFP only lab (schedule with Thalia Bloodgood, CNM).  Future Appointments  Date Time Provider Department Center  10/11/2019  9:30 AM Gwyndolyn Saxon, LCSW [facility] None  11/03/2019  9:30 AM [facility] NURSE [facility]  11/03/2019  9:45 AM [facility] US4 [facility]  03/29/2020  9:30 AM Olivia Mackie, NP [facility]  06/21/2020  1:45 PM Avanell Shackleton, NP-C [facility]    Jaynie Collins, MD

## 2019-10-01 ENCOUNTER — Telehealth: Payer: Self-pay | Admitting: Physician Assistant

## 2019-10-01 NOTE — Telephone Encounter (Signed)
I spoke with pt directly regarding an earlier message.  She notes her side effects have completely resolved at this time.  No further action was required.   She has been using Flexeril nearly nightly for the last 2 weeks.  She notes it helps with sleep but no other definitive response.  She has not tried Reglan. She continues to have vertigo but notes improvement to some degree.  Headaches better since injections.  Pt advised to try reglan and make next appt if needed.   Pt agreeable.   KTC

## 2019-10-11 ENCOUNTER — Ambulatory Visit (INDEPENDENT_AMBULATORY_CARE_PROVIDER_SITE_OTHER): Payer: Medicaid Other | Admitting: Licensed Clinical Social Worker

## 2019-10-11 DIAGNOSIS — F419 Anxiety disorder, unspecified: Secondary | ICD-10-CM

## 2019-10-11 DIAGNOSIS — O99340 Other mental disorders complicating pregnancy, unspecified trimester: Secondary | ICD-10-CM | POA: Diagnosis not present

## 2019-10-11 NOTE — BH Specialist Note (Signed)
Integrated Behavioral Health Follow Up Visit  MRN: [id number] Name: Selena Malone  Number of Integrated Behavioral Health Clinician visits: 2 Session Start time: 9:32am Session End time: 10:10am Total time: 38 mins via mychart   Type of Service: Integrated Behavioral Health- Individual Interpretor:no  Interpretor Name and Language: none  SUBJECTIVE: Selena Malone is a 29 y.o. female accompanied by n/a Patient was referred by Farris Has NP for anxiety. Patient reports the following symptoms/concerns: anxious, feeling overwhelmed and parenting concerns  Duration of problem: approx one month ; Severity of problem: mild   OBJECTIVE: Mood: Good  and Affect: Cheerful  Risk of harm to self or others: Selena Malone denies any risk of harm to self or others.   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  Reduce symptoms of: anxiety   2.  Increase knowledge of diagnosis and implement coping skills to alleviate symptoms   3.  Demonstrate ability to: self manage symptoms   INTERVENTIONS: Interventions utilized:  Supportive counseling  Standardized Assessments completed:    Clinical Support from 08/27/2019 in [facility]  PHQ-9 Total Score 3      ASSESSMENT: Patient currently experiencing anxiety disorder affecting pregnancy   Patient may benefit from integrated behavioral health. Selena. Malone reports spouse is more helpful and supportive which helps with lowering her stress level and ability to practice coping skills. Selena. Malone reports parenting challenges with older son father. LCSWA A. Felton Clinton and pt discuss effective co parenting skills such as active listening, following the parenting agreement, and no arguing in front of children    PLAN: 1. Follow up with  behavioral health clinician on : 3-4 weeks virtually  2. Behavioral recommendations: Continue with mindfulness techniques, communicate needs and continue journal to reflect and process events.  3. Referral(s): none  4. "From scale of 1-10, how likely are you to follow plan?":   Selena Saxon, LCSW

## 2019-10-27 ENCOUNTER — Encounter: Payer: Self-pay | Admitting: Obstetrics and Gynecology

## 2019-10-27 ENCOUNTER — Ambulatory Visit (INDEPENDENT_AMBULATORY_CARE_PROVIDER_SITE_OTHER): Payer: Medicaid Other | Admitting: Obstetrics and Gynecology

## 2019-10-27 ENCOUNTER — Other Ambulatory Visit: Payer: Self-pay

## 2019-10-27 VITALS — BP 132/75 | HR 125 | Wt 217.4 lb

## 2019-10-27 DIAGNOSIS — D649 Anemia, unspecified: Secondary | ICD-10-CM

## 2019-10-27 DIAGNOSIS — Z3490 Encounter for supervision of normal pregnancy, unspecified, unspecified trimester: Secondary | ICD-10-CM

## 2019-10-27 DIAGNOSIS — Z3A18 18 weeks gestation of pregnancy: Secondary | ICD-10-CM

## 2019-10-27 NOTE — Progress Notes (Signed)
Prenatal Visit Note Date: 10/27/2019 Clinic: [facility]  Subjective:  Selena Malone is a 29 y.o. [id number] at [redacted]w[redacted]d being seen today for ongoing prenatal care.  She is currently monitored for the following issues for this low-risk pregnancy and has Chronic nonintractable headache; Anxiety and depression; Right lower quadrant abdominal pain; Family history of thyroid disease in mother; and Encounter for supervision of normal pregnancy, unspecified, unspecified trimester on their problem list.  Patient reports no complaints.   Contractions: Not present. Vag. Bleeding: None.  Movement: Present. Denies leaking of fluid.   The following portions of the patient's history were reviewed and updated as appropriate: allergies, current medications, past family history, past medical history, past social history, past surgical history and problem list. Problem list updated.  Objective:   Vitals:   10/27/19 1418  BP: 132/75  Pulse: (!) 125  Weight: 217 lb 6.4 oz (98.6 kg)    Fetal Status: Fetal Heart Rate (bpm): 149   Movement: Present     General:  Alert, oriented and cooperative. Patient is in no acute distress.  Skin: Skin is warm and dry. No rash noted.   Cardiovascular: Normal heart rate noted  Respiratory: Normal respiratory effort, no problems with respiration noted  Abdomen: Soft, gravid, appropriate for gestational age. Pain/Pressure: Absent     Pelvic:  Cervical exam deferred        Extremities: Normal range of motion.  Edema: None  Mental Status: Normal mood and affect. Normal behavior. Normal judgment and thought content.   Urinalysis:      Assessment and Plan:  Pregnancy: L3Y1017 at [redacted]w[redacted]d  1. Encounter for supervision of normal pregnancy, antepartum, unspecified gravidity Routine care. Anatomy u/s next week. Weight stable - AFP, Serum, Open Spina Bifida   Preterm labor symptoms and general obstetric precautions including but not limited to vaginal  bleeding, contractions, leaking of fluid and fetal movement were reviewed in detail with the patient. Please refer to After Visit Summary for other counseling recommendations.  Return in about 1 month (around 11/27/2019) for low risk, in person.   [place] Bing, MD

## 2019-10-29 LAB — AFP, SERUM, OPEN SPINA BIFIDA
AFP MoM: 1.37
AFP Value: 53 ng/mL
Gest. Age on Collection Date: 18.6 weeks
Maternal Age At EDD: 32 yr
OSBR Risk 1 IN: 3920
Test Results:: NEGATIVE
Weight: 217 [lb_av]

## 2019-11-03 ENCOUNTER — Other Ambulatory Visit: Payer: Self-pay | Admitting: Women's Health

## 2019-11-03 ENCOUNTER — Other Ambulatory Visit: Payer: Self-pay

## 2019-11-03 ENCOUNTER — Other Ambulatory Visit: Payer: Self-pay | Admitting: *Deleted

## 2019-11-03 ENCOUNTER — Ambulatory Visit: Payer: Medicaid Other | Attending: Women's Health

## 2019-11-03 ENCOUNTER — Ambulatory Visit: Payer: Medicaid Other | Admitting: *Deleted

## 2019-11-03 DIAGNOSIS — Z362 Encounter for other antenatal screening follow-up: Secondary | ICD-10-CM

## 2019-11-03 DIAGNOSIS — Z3490 Encounter for supervision of normal pregnancy, unspecified, unspecified trimester: Secondary | ICD-10-CM | POA: Diagnosis present

## 2019-11-04 NOTE — Progress Notes (Unsigned)
EDD changed per MFM

## 2019-11-24 ENCOUNTER — Ambulatory Visit (INDEPENDENT_AMBULATORY_CARE_PROVIDER_SITE_OTHER): Payer: Medicaid Other | Admitting: Student

## 2019-11-24 ENCOUNTER — Other Ambulatory Visit: Payer: Self-pay

## 2019-11-24 VITALS — BP 111/73 | HR 99 | Wt 222.0 lb

## 2019-11-24 DIAGNOSIS — Z3A23 23 weeks gestation of pregnancy: Secondary | ICD-10-CM

## 2019-11-24 DIAGNOSIS — Z3480 Encounter for supervision of other normal pregnancy, unspecified trimester: Secondary | ICD-10-CM

## 2019-11-24 NOTE — Progress Notes (Signed)
   PRENATAL VISIT NOTE  Subjective:  Selena Malone is a 29 y.o. O1B5102 at [redacted]w[redacted]d being seen today for ongoing prenatal care.  She is currently monitored for the following issues for this low-risk pregnancy and has Chronic nonintractable headache; Anxiety and depression; Right lower quadrant abdominal pain; Family history of thyroid disease in mother; and Encounter for supervision of normal pregnancy, unspecified, unspecified trimester on their problem list.  Patient reports no complaints other than sciatica. She wants to know if she can have a hysterrectomy. She also wants to know if its a problem that the baby was breech at her [place].  Contractions: Not present. Vag. Bleeding: None.  Movement: Present. Denies leaking of fluid.   The following portions of the patient's history were reviewed and updated as appropriate: allergies, current medications, past family history, past medical history, past social history, past surgical history and problem list.   Objective:   Vitals:   11/24/19 1028  BP: 111/73  Pulse: 99  Weight: 222 lb (100.7 kg)    Fetal Status: Fetal Heart Rate (bpm): 151 Fundal Height: 25 cm Movement: Present     General:  Alert, oriented and cooperative. Patient is in no acute distress.  Skin: Skin is warm and dry. No rash noted.   Cardiovascular: Normal heart rate noted  Respiratory: Normal respiratory effort, no problems with respiration noted  Abdomen: Soft, gravid, appropriate for gestational age.  Pain/Pressure: Absent     Pelvic: Cervical exam deferred        Extremities: Normal range of motion.  Edema: None  Mental Status: Normal mood and affect. Normal behavior. Normal judgment and thought content.   Assessment and Plan:  Pregnancy: H8N2778 at [redacted]w[redacted]d  1. Supervision of other normal pregnancy, antepartum    2. Reviewed stretching and exercises for sciatica.  Discussed hysterectomy; patient to have appt with MD to talk with surgery Patient wants circ  inpatient.  Reassured patient that baby being breech at anatomy scan is ok, we will continue to monitor position of baby and baby has plenty of time to turn.  Reviewed GTT next visit Patient declinced referral to PT for sciatica.   Preterm labor symptoms and general obstetric precautions including but not limited to vaginal bleeding, contractions, leaking of fluid and fetal movement were reviewed in detail with the patient. Please refer to After Visit Summary for other counseling recommendations.   Return in about 4 weeks (around 12/22/2019), or 2 hour gtt and meet with MD about hysterectomy.  Future Appointments  Date Time Provider Department Center  12/01/2019 10:30 AM [facility] NURSE [facility]  12/01/2019 10:45 AM [facility] US4 [facility]  03/29/2020  9:30 AM Olivia Mackie, NP [facility]  06/21/2020  1:45 PM Avanell Shackleton, NP-C [facility]    Marylene Land, CNM

## 2019-11-24 NOTE — Patient Instructions (Signed)
Glucose Tolerance Test °Why am I having this test? °The glucose tolerance test (GTT) is done to check how your body processes sugar (glucose). This is one of several tests used to diagnose diabetes (diabetes mellitus). °Your health care provider may recommend this test if you: °· Have a family history of diabetes. °· Are very overweight (obese). °· Have infections that keep coming back (recurring). °· Have had a lot of wounds that did not heal quickly, especially on your legs and feet. °· Are a woman and have a history of giving birth to very large babies or a history of repeated fetal loss (stillbirth). °· Have had high glucose levels in your urine or blood: °? During a past pregnancy. °? After a heart attack, surgery, or prolonged periods of high stress. °What is being tested? °This test measures the amount of glucose in your blood at different times during a period of 2 hours. This indicates how well your body is able to process glucose. °What kind of sample is taken? ° °Blood samples are required for this test. They are usually collected by inserting a needle into a blood vessel. °How do I prepare for this test? °· For 3 days before your test, eat normally. Have plenty of carbohydrate-rich foods. °· Follow instructions from your health care provider about: °? Eating or drinking restrictions on the day of the test. You may be asked to not eat or drink anything other than water (fast) starting 8-12 hours before the test. °? Changing or stopping your regular medicines. Some medicines may interfere with this test. °Tell a health care provider about: °· All medicines you are taking, including vitamins, herbs, eye drops, creams, and over-the-counter medicines. °· Any blood disorders you have. °· Any surgeries you have had. °· Any medical conditions you have. °· Whether you are pregnant or may be pregnant. °What happens during the test? °First, your blood glucose will be measured. This is referred to as your fasting  blood glucose, since you fasted before the test. Then, you will drink a glucose solution that contains a certain amount of glucose. Your blood glucose will be measured again 1 and 2 hours after drinking the solution. °This test takes 2 hours to complete. You will need to stay at the testing location during this time. During the testing period: °· Do not eat or drink anything other than the glucose solution. You will be allowed to drink water. °· Do not exercise. °· Do not use any products that contain nicotine or tobacco, such as cigarettes and e-cigarettes. If you need help stopping, ask your health care provider. °The testing procedure may vary among health care providers and hospitals. °How are the results reported? °Your results will be reported as milligrams of glucose per deciliter of blood (mg/dL) or millimoles per liter (mmol/L). Your health care provider will compare your results to normal ranges that were established after testing a large group of people (reference ranges). Reference ranges may vary among labs and hospitals. For this test, common reference ranges are: °· Fasting: less than 110 mg/dL (6.1 mmol/L). °· 1 hour after drinking glucose: less than 180 mg/dL (10.0 mmol/L). °· 2 hours after drinking glucose: less than 140 mg/dL (7.8 mmol/L). °What do the results mean? °Results that are within the reference ranges are considered normal, meaning that your glucose levels are well controlled. Results higher than the reference ranges may mean that you recently experienced stress, such as from an injury or a sudden (acute) condition like a   heart attack or stroke, or that you have: °· Diabetes. °· Cushing syndrome. °· Tumors such as pheochromocytoma or glucagonoma. °· Kidney failure. °· Pancreatitis. °· Hyperthyroidism. °· An infection. °Talk with your health care provider about what your results mean. °Questions to ask your health care provider °Ask your health care provider, or the department that is  doing the test: °· When will my results be ready? °· How will I get my results? °· What are my treatment options? °· What other tests do I need? °· What are my next steps? °Summary °· The glucose tolerance test (GTT) is done to check how your body processes sugar (glucose). This is one of several tests used to diagnose diabetes (diabetes mellitus). °· This test measures the amount of glucose in your blood at different times during a period of 2 hours. This indicates how well your body is able to process glucose. °· Talk with your health care provider about what your results mean. °This information is not intended to replace advice given to you by your health care provider. Make sure you discuss any questions you have with your health care provider. °Document Revised: 01/31/2017 Document Reviewed: 09/30/2016 °Elsevier Patient Education © 2020 [employer] ° °

## 2019-12-01 ENCOUNTER — Other Ambulatory Visit: Payer: Self-pay

## 2019-12-01 ENCOUNTER — Ambulatory Visit: Payer: Medicaid Other | Admitting: *Deleted

## 2019-12-01 ENCOUNTER — Ambulatory Visit: Payer: Medicaid Other | Attending: Obstetrics and Gynecology

## 2019-12-01 DIAGNOSIS — Z3480 Encounter for supervision of other normal pregnancy, unspecified trimester: Secondary | ICD-10-CM | POA: Insufficient documentation

## 2019-12-01 DIAGNOSIS — E669 Obesity, unspecified: Secondary | ICD-10-CM | POA: Diagnosis not present

## 2019-12-01 DIAGNOSIS — Z3A24 24 weeks gestation of pregnancy: Secondary | ICD-10-CM

## 2019-12-01 DIAGNOSIS — O99212 Obesity complicating pregnancy, second trimester: Secondary | ICD-10-CM

## 2019-12-01 DIAGNOSIS — Z3687 Encounter for antenatal screening for uncertain dates: Secondary | ICD-10-CM

## 2019-12-01 DIAGNOSIS — Z362 Encounter for other antenatal screening follow-up: Secondary | ICD-10-CM

## 2019-12-23 ENCOUNTER — Other Ambulatory Visit: Payer: Self-pay

## 2019-12-23 ENCOUNTER — Ambulatory Visit (INDEPENDENT_AMBULATORY_CARE_PROVIDER_SITE_OTHER): Payer: Medicaid Other | Admitting: Family Medicine

## 2019-12-23 VITALS — BP 103/62 | HR 103 | Wt 222.0 lb

## 2019-12-23 DIAGNOSIS — Z3A27 27 weeks gestation of pregnancy: Secondary | ICD-10-CM

## 2019-12-23 DIAGNOSIS — Z23 Encounter for immunization: Secondary | ICD-10-CM

## 2019-12-23 DIAGNOSIS — Z3480 Encounter for supervision of other normal pregnancy, unspecified trimester: Secondary | ICD-10-CM

## 2019-12-23 LAB — CBC
Hematocrit: 32.4 % — ABNORMAL LOW (ref 34.0–46.6)
Hemoglobin: 10.9 g/dL — ABNORMAL LOW (ref 11.1–15.9)
MCH: 32.9 pg (ref 26.6–33.0)
MCHC: 33.6 g/dL (ref 31.5–35.7)
MCV: 98 fL — ABNORMAL HIGH (ref 79–97)
Platelets: 188 10*3/uL (ref 150–450)
RBC: 3.31 x10E6/uL — ABNORMAL LOW (ref 3.77–5.28)
RDW: 12.6 % (ref 11.7–15.4)
WBC: 5.8 10*3/uL (ref 3.4–10.8)

## 2019-12-23 NOTE — Progress Notes (Signed)
   PRENATAL VISIT NOTE  Subjective:  Rionna Hasting is a 29 y.o. [id number] at [redacted]w[redacted]d being seen today for ongoing prenatal care.  She is currently monitored for the following issues for this high-risk pregnancy and has Chronic nonintractable headache; Anxiety and depression; Right lower quadrant abdominal pain; Family history of thyroid disease in mother; and Encounter for supervision of normal pregnancy, unspecified, unspecified trimester on their problem list.  Patient reports no complaints.  Contractions: Irritability. Vag. Bleeding: None.  Movement: Present. Denies leaking of fluid.   The following portions of the patient's history were reviewed and updated as appropriate: allergies, current medications, past family history, past medical history, past social history, past surgical history and problem list.   Objective:   Vitals:   12/23/19 0841  BP: 103/62  Pulse: (!) 103  Weight: 222 lb (100.7 kg)    Fetal Status: Fetal Heart Rate (bpm): 146 Fundal Height: 27 cm Movement: Present     General:  Alert, oriented and cooperative. Patient is in no acute distress.  Skin: Skin is warm and dry. No rash noted.   Cardiovascular: Normal heart rate noted  Respiratory: Normal respiratory effort, no problems with respiration noted  Abdomen: Soft, gravid, appropriate for gestational age.  Pain/Pressure: Present     Pelvic: Cervical exam deferred        Extremities: Normal range of motion.  Edema: None  Mental Status: Normal mood and affect. Normal behavior. Normal judgment and thought content.   Assessment and Plan:  Pregnancy: [id number] at [redacted]w[redacted]d 1. Supervision of other normal pregnancy, antepartum 28 wk labs today - Glucose Tolerance, 2 Hours w/1 Hour - CBC - RPR - HIV Antibody (routine testing w rflx)  Preterm labor symptoms and general obstetric precautions including but not limited to vaginal bleeding, contractions, leaking of fluid and fetal movement were reviewed in detail with  the patient. Please refer to After Visit Summary for other counseling recommendations.   Return in 2 weeks (on 01/06/2020).  Future Appointments  Date Time Provider Department Center  01/06/2020  2:00 PM Reva Bores, MD [facility]  03/29/2020  9:30 AM Olivia Mackie, NP [facility]  06/21/2020  1:45 PM Avanell Shackleton, NP-C [facility]    Reva Bores, MD

## 2019-12-23 NOTE — Patient Instructions (Signed)

## 2019-12-24 LAB — RPR: RPR Ser Ql: NONREACTIVE

## 2019-12-24 LAB — GLUCOSE TOLERANCE, 2 HOURS W/ 1HR
Glucose, 1 hour: 82 mg/dL (ref 65–179)
Glucose, 2 hour: 94 mg/dL (ref 65–152)
Glucose, Fasting: 86 mg/dL (ref 65–91)

## 2019-12-24 LAB — HIV ANTIBODY (ROUTINE TESTING W REFLEX): HIV Screen 4th Generation wRfx: NONREACTIVE

## 2020-01-06 ENCOUNTER — Ambulatory Visit (INDEPENDENT_AMBULATORY_CARE_PROVIDER_SITE_OTHER): Payer: Medicaid Other | Admitting: Family Medicine

## 2020-01-06 ENCOUNTER — Other Ambulatory Visit: Payer: Self-pay

## 2020-01-06 VITALS — BP 113/72 | HR 99 | Wt 223.0 lb

## 2020-01-06 DIAGNOSIS — F32A Depression, unspecified: Secondary | ICD-10-CM

## 2020-01-06 DIAGNOSIS — F419 Anxiety disorder, unspecified: Secondary | ICD-10-CM

## 2020-01-06 DIAGNOSIS — Z3480 Encounter for supervision of other normal pregnancy, unspecified trimester: Secondary | ICD-10-CM

## 2020-01-06 MED ORDER — SERTRALINE HCL 25 MG PO TABS: 25.0000 mg | ORAL_TABLET | Freq: Every day | ORAL | 2 refills | Status: DC

## 2020-01-06 NOTE — Progress Notes (Signed)
discuss anxiety meds , anxiety has gotten worse

## 2020-01-07 NOTE — Progress Notes (Signed)
    PRENATAL VISIT NOTE  Subjective:  Selena Malone is a 29 y.o. [id number] at [redacted]w[redacted]d being seen today for ongoing prenatal care.  She is currently monitored for the following issues for this low-risk pregnancy and has Chronic nonintractable headache; Anxiety and depression; Right lower quadrant abdominal pain; Family history of thyroid disease in mother; and Encounter for supervision of normal pregnancy, unspecified, unspecified trimester on their problem list.  Patient reports anxiety and worsening symptoms, inability to sleep.  Contractions: Irritability. Vag. Bleeding: None.  Movement: Present. Denies leaking of fluid.   The following portions of the patient's history were reviewed and updated as appropriate: allergies, current medications, past family history, past medical history, past social history, past surgical history and problem list.   Objective:   Vitals:   01/06/20 1411  BP: 113/72  Pulse: 99  Weight: 223 lb (101.2 kg)    Fetal Status: Fetal Heart Rate (bpm): 145   Movement: Present     General:  Alert, oriented and cooperative. Patient is in no acute distress.  Skin: Skin is warm and dry. No rash noted.   Cardiovascular: Normal heart rate noted  Respiratory: Normal respiratory effort, no problems with respiration noted  Abdomen: Soft, gravid, appropriate for gestational age.  Pain/Pressure: Present     Pelvic: Cervical exam deferred        Extremities: Normal range of motion.  Edema: None  Mental Status: Normal mood and affect. Normal behavior. Normal judgment and thought content.   Assessment and Plan:  Pregnancy: [id number] at [redacted]w[redacted]d 1. Supervision of other normal pregnancy, antepartum Continue routine prenatal care.  2. Anxiety and depression Begin low dose SSRI. Risks/benefits reviewed including slight risk of PPH in newborn. Normal effective dose, timing of effectiveness, pp issues reviewed. No true mania noted. Previously on amitriptyline - sertraline  (ZOLOFT) 25 MG tablet; Take 1 tablet (25 mg total) by mouth daily.  Dispense: 30 tablet; Refill: 2  Preterm labor symptoms and general obstetric precautions including but not limited to vaginal bleeding, contractions, leaking of fluid and fetal movement were reviewed in detail with the patient. Please refer to After Visit Summary for other counseling recommendations.   No follow-ups on file.  Future Appointments  Date Time Provider Department Center  01/17/2020  4:00 PM Adam Phenix, MD [facility]  02/03/2020  3:15 PM Reva Bores, MD [facility]  03/29/2020  9:30 AM Olivia Mackie, NP [facility]  06/21/2020  1:45 PM Avanell Shackleton, NP-C [facility]    Reva Bores, MD

## 2020-01-09 ENCOUNTER — Other Ambulatory Visit: Payer: Self-pay

## 2020-01-09 ENCOUNTER — Ambulatory Visit
Admission: RE | Admit: 2020-01-09 | Discharge: 2020-01-09 | Disposition: A | Discharge status: Home / Self Care | Payer: Medicaid Other | Source: Ambulatory Visit | Attending: Emergency Medicine | Admitting: Emergency Medicine

## 2020-01-09 VITALS — BP 105/68 | HR 98 | Temp 97.9°F | Resp 18 | Ht 64.0 in | Wt 222.0 lb

## 2020-01-09 DIAGNOSIS — J069 Acute upper respiratory infection, unspecified: Secondary | ICD-10-CM | POA: Diagnosis not present

## 2020-01-09 MED ORDER — AZITHROMYCIN 250 MG PO TABS: 250.0000 mg | ORAL_TABLET | Freq: Every day | ORAL | 0 refills | Status: DC

## 2020-01-09 MED ORDER — GUAIFENESIN 200 MG PO TABS: 200.0000 mg | ORAL_TABLET | Freq: Three times a day (TID) | ORAL | 0 refills | Status: DC | PRN

## 2020-01-09 NOTE — ED Provider Notes (Signed)
[place]   [id number] 01/09/20 Arrival Time: 4098   CC: URI  SUBJECTIVE: History from: patient.  Selena Malone is a 29 y.o. female was pregnant presented to the urgent care with a complaint of nasal congestion with green discharge and cough for the past past few days.  Report daughter has the same symptom.  Daughter has tested negative for Covid, flu and RSV.Marland Kitchen  Denies recent travel.  Has tried OTC Sudafed without relief.  Denies alleviating or aggravating factors.  Denies previous symptoms in the past.   Denies fever, chills, fatigue, sinus pain, rhinorrhea, sore throat, SOB, wheezing, chest pain, nausea, changes in bowel or bladder habits.    ROS: As per HPI.  All other pertinent ROS negative.      Past Medical History:  Diagnosis Date  . Anxiety and depression   . Chronic headaches   . Iron deficiency anemia   . Low blood hemoglobin A2 08/11/2017   Negative alpha and beta thal on [facility] testing.    Past Surgical History:  Procedure Laterality Date  . TONSILLECTOMY     Allergies  Allergen Reactions  . Codeine Itching   No current facility-administered medications on file prior to encounter.   Current Outpatient Medications on File Prior to Encounter  Medication Sig Dispense Refill  . aspirin EC 81 MG tablet Take 1 tablet (81 mg total) by mouth daily. Swallow whole. (Patient not taking: Reported on 09/17/2019) 30 tablet 11  . Blood Pressure Monitoring (BLOOD PRESSURE KIT) DEVI 1 kit by Does not apply route once a week. Check Blood Pressure regularly and record readings into the [employer] App.  Large Cuff.  DX O90.0 (Patient not taking: Reported on 09/29/2019) 1 each 0  . cholecalciferol (VITAMIN D3) 25 MCG (1000 UNIT) tablet Take 1,000 Units by mouth daily. (Patient not taking: Reported on 11/03/2019)    . cyclobenzaprine (FLEXERIL) 10 MG tablet Take 1 tablet (10 mg total) by mouth every 8 (eight) hours as needed for muscle spasms. (Patient not taking:  Reported on 11/03/2019) 30 tablet 1  . Homeopathic Products (ALLERGY MEDICINE PO) Take by mouth.     . metoCLOPramide (REGLAN) 10 MG tablet Take 1 tablet (10 mg total) by mouth 3 (three) times daily as needed for nausea. (Patient not taking: Reported on 11/03/2019) 20 tablet 1  . Multiple Vitamin (MULTIVITAMIN) tablet Take 1 tablet by mouth daily. (Patient not taking: Reported on 11/03/2019)    . Prenatal Vit w/Fe-Methylfol-FA (PNV PO) Take by mouth.    . sertraline (ZOLOFT) 25 MG tablet Take 1 tablet (25 mg total) by mouth daily. 30 tablet 2   Social History   Socioeconomic History  . Marital status: Married    Spouse name: Not on file  . Number of children: Not on file  . Years of education: Not on file  . Highest education level: Not on file  Occupational History  . Not on file  Tobacco Use  . Smoking status: Former Smoker    Packs/day: 0.40    Years: 10.00    Pack years: 4.00    Quit date: 03/23/2017    Years since quitting: 2.8  . Smokeless tobacco: Never Used  Vaping Use  . Vaping Use: Never used  Substance and Sexual Activity  . Alcohol use: Not Currently  . Drug use: Not Currently  . Sexual activity: Yes    Partners: Male    Birth control/protection: None    Comment: 1st intercourse-13, partners- 7, married-3 yrs,  Other Topics Concern  . Not on file  Social History Narrative   ** Merged History Encounter **       Social Determinants of Health   Financial Resource Strain:   . Difficulty of Paying Living Expenses: Not on file  Food Insecurity:   . Worried About Charity fundraiser in the Last Year: Not on file  . Ran Out of Food in the Last Year: Not on file  Transportation Needs:   . Lack of Transportation (Medical): Not on file  . Lack of Transportation (Non-Medical): Not on file  Physical Activity:   . Days of Exercise per Week: Not on file  . Minutes of Exercise per Session: Not on file  Stress:   . Feeling of Stress : Not on file  Social Connections:   .  Frequency of Communication with Friends and Family: Not on file  . Frequency of Social Gatherings with Friends and Family: Not on file  . Attends Religious Services: Not on file  . Active Member of Clubs or Organizations: Not on file  . Attends Archivist Meetings: Not on file  . Marital Status: Not on file  Intimate Partner Violence:   . Fear of Current or Ex-Partner: Not on file  . Emotionally Abused: Not on file  . Physically Abused: Not on file  . Sexually Abused: Not on file   Family History  Problem Relation Age of Onset  . Graves' disease Mother   . Migraines Mother   . Cancer Paternal Aunt   . Thyroid disease Paternal Aunt   . Cancer Maternal Grandmother   . Depression Maternal Grandfather   . Cancer Paternal Grandfather     OBJECTIVE:  Vitals:   01/09/20 1213  BP: 105/68  Pulse: 98  Resp: 18  Temp: 97.9 F (36.6 C)  TempSrc: Oral  SpO2: 97%  Weight: 222 lb (100.7 kg)  Height: '5\' 4"'  (1.626 m)     General appearance: alert; appears fatigued, but nontoxic; speaking in full sentences and tolerating own secretions HEENT: NCAT; Ears: EACs clear, TMs pearly gray; Eyes: PERRL.  EOM grossly intact. Sinuses: nontender; Nose: nares patent without rhinorrhea, Throat: oropharynx clear, tonsils non erythematous or enlarged, uvula midline  Neck: supple without LAD Lungs: unlabored respirations, symmetrical air entry; cough: mild; no respiratory distress; CTAB Heart: regular rate and rhythm.  Radial pulses 2+ symmetrical bilaterally Skin: warm and dry Psychological: alert and cooperative; normal mood and affect  LABS:  No results found for this or any previous visit (from the past 24 hour(s)).   ASSESSMENT & PLAN:  1. Acute URI     Meds ordered this encounter  Medications  . azithromycin (ZITHROMAX) 250 MG tablet    Sig: Take 1 tablet (250 mg total) by mouth daily. Take first 2 tablets together, then 1 every day until finished.    Dispense:  6 tablet     Refill:  0  . guaiFENesin 200 MG tablet    Sig: Take 1 tablet (200 mg total) by mouth every 8 (eight) hours as needed for cough or to loosen phlegm.    Dispense:  30 tablet    Refill:  0    Discharge instructions  Get plenty of rest and push fluids Guaifenesin was prescribed Continue to take OTC Sudafed as directed Use OTC Flonase for congestion Azithromycin was prescribed Use medications daily for symptom relief Use OTC medications like ibuprofen or tylenol as needed fever or pain Call or go to the  ED if you have any new or worsening symptoms such as fever, worsening cough, shortness of breath, chest tightness, chest pain, turning blue, changes in mental status, etc...   Reviewed expectations re: course of current medical issues. Questions answered. Outlined signs and symptoms indicating need for more acute intervention. Patient verbalized understanding. After Visit Summary given.         Emerson Monte, [place] 01/09/20 1241

## 2020-01-09 NOTE — Discharge Instructions (Addendum)
Get plenty of rest and push fluids Guaifenesin was prescribed Continue to take OTC Sudafed as directed Use OTC Flonase for congestion Azithromycin was prescribed Use medications daily for symptom relief Use OTC medications like ibuprofen or tylenol as needed fever or pain Call or go to the ED if you have any new or worsening symptoms such as fever, worsening cough, shortness of breath, chest tightness, chest pain, turning blue, changes in mental status, etc..Marland Kitchen

## 2020-01-09 NOTE — ED Triage Notes (Addendum)
Sinus congestion and cough. Pt states her daughter had same symptoms Wednesday, had neg covid/ flu and rsv.  Pt is [redacted] weeks pregnant.

## 2020-01-17 ENCOUNTER — Ambulatory Visit (INDEPENDENT_AMBULATORY_CARE_PROVIDER_SITE_OTHER): Payer: Medicaid Other | Admitting: Obstetrics & Gynecology

## 2020-01-17 ENCOUNTER — Other Ambulatory Visit: Payer: Self-pay

## 2020-01-17 VITALS — BP 102/68 | HR 90 | Wt 224.0 lb

## 2020-01-17 DIAGNOSIS — Z3480 Encounter for supervision of other normal pregnancy, unspecified trimester: Secondary | ICD-10-CM

## 2020-01-17 NOTE — Progress Notes (Signed)
   PRENATAL VISIT NOTE  Subjective:  Selena Malone is a 29 y.o. [id number] at [redacted]w[redacted]d being seen today for ongoing prenatal care.  She is currently monitored for the following issues for this low-risk pregnancy and has Chronic nonintractable headache; Anxiety and depression; Right lower quadrant abdominal pain; Family history of thyroid disease in mother; and Encounter for supervision of normal pregnancy, unspecified, unspecified trimester on their problem list.  Patient reports pelvic pressure.  Contractions: Irritability. Vag. Bleeding: None.  Movement: Present. Denies leaking of fluid.   The following portions of the patient's history were reviewed and updated as appropriate: allergies, current medications, past family history, past medical history, past social history, past surgical history and problem list.   Objective:   Vitals:   01/17/20 1548  BP: 102/68  Pulse: 90  Weight: 224 lb (101.6 kg)    Fetal Status: Fetal Heart Rate (bpm): 150   Movement: Present     General:  Alert, oriented and cooperative. Patient is in no acute distress.  Skin: Skin is warm and dry. No rash noted.   Cardiovascular: Normal heart rate noted  Respiratory: Normal respiratory effort, no problems with respiration noted  Abdomen: Soft, gravid, appropriate for gestational age.  Pain/Pressure: Absent     Pelvic: Cervical exam deferred        Extremities: Normal range of motion.  Edema: None  Mental Status: Normal mood and affect. Normal behavior. Normal judgment and thought content.   Assessment and Plan:  Pregnancy: [id number] at [redacted]w[redacted]d 1. Supervision of other normal pregnancy, antepartum Doing well  Preterm labor symptoms and general obstetric precautions including but not limited to vaginal bleeding, contractions, leaking of fluid and fetal movement were reviewed in detail with the patient. Please refer to After Visit Summary for other counseling recommendations.   Return in about 2 weeks (around  01/31/2020).  Future Appointments  Date Time Provider Department Center  02/03/2020  3:15 PM Reva Bores, MD [facility]  02/15/2020  4:00 PM [place] Bing, MD [facility]  03/29/2020  9:30 AM Olivia Mackie, NP [facility]  06/21/2020  1:45 PM Avanell Shackleton, NP-C [facility]    Scheryl Darter, MD

## 2020-01-17 NOTE — Patient Instructions (Signed)

## 2020-01-28 ENCOUNTER — Other Ambulatory Visit: Payer: Self-pay | Admitting: Family Medicine

## 2020-01-28 DIAGNOSIS — F419 Anxiety disorder, unspecified: Secondary | ICD-10-CM

## 2020-01-28 DIAGNOSIS — F32A Depression, unspecified: Secondary | ICD-10-CM

## 2020-02-03 ENCOUNTER — Ambulatory Visit (INDEPENDENT_AMBULATORY_CARE_PROVIDER_SITE_OTHER): Payer: Medicaid Other | Admitting: Family Medicine

## 2020-02-03 ENCOUNTER — Other Ambulatory Visit: Payer: Self-pay

## 2020-02-03 VITALS — BP 116/71 | HR 97 | Wt 225.4 lb

## 2020-02-03 DIAGNOSIS — F32A Depression, unspecified: Secondary | ICD-10-CM

## 2020-02-03 DIAGNOSIS — F419 Anxiety disorder, unspecified: Secondary | ICD-10-CM

## 2020-02-03 DIAGNOSIS — Z3480 Encounter for supervision of other normal pregnancy, unspecified trimester: Secondary | ICD-10-CM

## 2020-02-03 MED ORDER — SERTRALINE HCL 50 MG PO TABS: 50.0000 mg | ORAL_TABLET | Freq: Every day | ORAL | 1 refills | Status: DC

## 2020-02-03 NOTE — Progress Notes (Signed)
   PRENATAL VISIT NOTE  Subjective:  Selena Malone is a 29 y.o. [id number] at [redacted]w[redacted]d being seen today for ongoing prenatal care.  She is currently monitored for the following issues for this low-risk pregnancy and has Chronic nonintractable headache; Anxiety and depression; Right lower quadrant abdominal pain; Family history of thyroid disease in mother; and Encounter for supervision of normal pregnancy, unspecified, unspecified trimester on their problem list.  Patient reports no complaints.  Contractions: Irritability. Vag. Bleeding: None.  Movement: Present. Denies leaking of fluid.   The following portions of the patient's history were reviewed and updated as appropriate: allergies, current medications, past family history, past medical history, past social history, past surgical history and problem list.   Objective:   Vitals:   02/03/20 1536  BP: 116/71  Pulse: 97  Weight: 225 lb 6.4 oz (102.2 kg)    Fetal Status: Fetal Heart Rate (bpm): 145 Fundal Height: 35 cm Movement: Present     General:  Alert, oriented and cooperative. Patient is in no acute distress.  Skin: Skin is warm and dry. No rash noted.   Cardiovascular: Normal heart rate noted  Respiratory: Normal respiratory effort, no problems with respiration noted  Abdomen: Soft, gravid, appropriate for gestational age.  Pain/Pressure: Present     Pelvic: Cervical exam deferred        Extremities: Normal range of motion.  Edema: None  Mental Status: Normal mood and affect. Normal behavior. Normal judgment and thought content.   Assessment and Plan:  Pregnancy: [id number] at [redacted]w[redacted]d 1. Supervision of other normal pregnancy, antepartum Continue routine prenatal care.   2. Anxiety and depression Still with anxiety--will increase dosing to 50 mg. - sertraline (ZOLOFT) 50 MG tablet; Take 1 tablet (50 mg total) by mouth daily.  Dispense: 60 tablet; Refill: 1  Preterm labor symptoms and general obstetric precautions including  but not limited to vaginal bleeding, contractions, leaking of fluid and fetal movement were reviewed in detail with the patient. Please refer to After Visit Summary for other counseling recommendations.   Return in 2 weeks (on 02/17/2020).  Future Appointments  Date Time Provider Department Center  02/15/2020  4:00 PM [place] Bing, MD [facility]  02/24/2020  3:45 PM Anyanwu, Jethro Bastos, MD [facility]  03/02/2020  3:45 PM Anyanwu, Jethro Bastos, MD [facility]  03/09/2020  3:45 PM [place] Bing, MD [facility]  03/29/2020  9:30 AM Olivia Mackie, NP [facility]  06/21/2020  1:45 PM Avanell Shackleton, NP-C [facility]    Reva Bores, MD

## 2020-02-03 NOTE — Patient Instructions (Signed)

## 2020-02-15 ENCOUNTER — Other Ambulatory Visit: Payer: Self-pay

## 2020-02-15 ENCOUNTER — Ambulatory Visit (INDEPENDENT_AMBULATORY_CARE_PROVIDER_SITE_OTHER): Payer: Medicaid Other | Admitting: Obstetrics and Gynecology

## 2020-02-15 VITALS — BP 113/74 | HR 97 | Wt 224.2 lb

## 2020-02-15 DIAGNOSIS — Z3480 Encounter for supervision of other normal pregnancy, unspecified trimester: Secondary | ICD-10-CM

## 2020-02-15 DIAGNOSIS — F419 Anxiety disorder, unspecified: Secondary | ICD-10-CM

## 2020-02-15 DIAGNOSIS — F32A Depression, unspecified: Secondary | ICD-10-CM

## 2020-02-15 DIAGNOSIS — Z3A35 35 weeks gestation of pregnancy: Secondary | ICD-10-CM

## 2020-02-15 NOTE — Progress Notes (Signed)
Prenatal Visit Note Date: 02/15/2020 Clinic: [facility]  Subjective:  Selena Malone is a 29 y.o. [id number] at [redacted]w[redacted]d being seen today for ongoing prenatal care.  She is currently monitored for the following issues for this low-risk pregnancy and has Chronic nonintractable headache; Anxiety and depression; Right lower quadrant abdominal pain; Family history of thyroid disease in mother; and Encounter for supervision of normal pregnancy, unspecified, unspecified trimester on their problem list.  Patient reports no complaints.   Contractions: Irritability. Vag. Bleeding: None.  Movement: Present. Denies leaking of fluid.   The following portions of the patient's history were reviewed and updated as appropriate: allergies, current medications, past family history, past medical history, past social history, past surgical history and problem list. Problem list updated.  Objective:   Vitals:   02/15/20 1604  BP: 113/74  Pulse: 97  Weight: 224 lb 3.2 oz (101.7 kg)    Fetal Status: Fetal Heart Rate (bpm): 131 Fundal Height: 36 cm Movement: Present  Presentation: Vertex  General:  Alert, oriented and cooperative. Patient is in no acute distress.  Skin: Skin is warm and dry. No rash noted.   Cardiovascular: Normal heart rate noted  Respiratory: Normal respiratory effort, no problems with respiration noted  Abdomen: Soft, gravid, appropriate for gestational age. Pain/Pressure: Present     Pelvic:  Cervical exam deferred        Extremities: Normal range of motion.     Mental Status: Normal mood and affect. Normal behavior. Normal judgment and thought content.   Urinalysis:      Assessment and Plan:  Pregnancy: [id number] at [redacted]w[redacted]d  1. Supervision of other normal pregnancy, antepartum Routine care. GBS neg  2. Anxiety and depression Doing well on the increased zoloft  Preterm labor symptoms and general obstetric precautions including but not limited to vaginal  bleeding, contractions, leaking of fluid and fetal movement were reviewed in detail with the patient. Please refer to After Visit Summary for other counseling recommendations.  RTC: Vickey Sages, MD

## 2020-02-24 ENCOUNTER — Ambulatory Visit (INDEPENDENT_AMBULATORY_CARE_PROVIDER_SITE_OTHER): Payer: Medicaid Other | Admitting: Obstetrics & Gynecology

## 2020-02-24 ENCOUNTER — Other Ambulatory Visit (HOSPITAL_COMMUNITY)
Admission: RE | Admit: 2020-02-24 | Discharge: 2020-02-24 | Disposition: A | Discharge status: Home / Self Care | Payer: Medicaid Other | Source: Ambulatory Visit | Attending: Obstetrics & Gynecology | Admitting: Obstetrics & Gynecology

## 2020-02-24 ENCOUNTER — Encounter: Payer: Medicaid Other | Admitting: Obstetrics & Gynecology

## 2020-02-24 ENCOUNTER — Other Ambulatory Visit: Payer: Self-pay

## 2020-02-24 VITALS — BP 126/70 | HR 104 | Wt 227.0 lb

## 2020-02-24 DIAGNOSIS — Z3A36 36 weeks gestation of pregnancy: Secondary | ICD-10-CM

## 2020-02-24 DIAGNOSIS — Z3480 Encounter for supervision of other normal pregnancy, unspecified trimester: Secondary | ICD-10-CM | POA: Diagnosis not present

## 2020-02-24 NOTE — Progress Notes (Signed)
   PRENATAL VISIT NOTE  Subjective:  Selena Malone is a 29 y.o. [id number] at [redacted]w[redacted]d being seen today for ongoing prenatal care.  She is currently monitored for the following issues for this low-risk pregnancy and has Chronic nonintractable headache; Anxiety and depression; Right lower quadrant abdominal pain; Family history of thyroid disease in mother; and Encounter for supervision of normal pregnancy, unspecified, unspecified trimester on their problem list.  Patient reports no complaints.  Contractions: Irritability. Vag. Bleeding: None.  Movement: Present. Denies leaking of fluid.   The following portions of the patient's history were reviewed and updated as appropriate: allergies, current medications, past family history, past medical history, past social history, past surgical history and problem list.   Objective:   Vitals:   02/24/20 1342  BP: 126/70  Pulse: (!) 104  Weight: 227 lb (103 kg)    Fetal Status: Fetal Heart Rate (bpm): 125 Fundal Height: 38 cm Movement: Present  Presentation: Vertex (confirmed on bedside ultrasound)  General:  Alert, oriented and cooperative. Patient is in no acute distress.  Skin: Skin is warm and dry. No rash noted.   Cardiovascular: Normal heart rate noted  Respiratory: Normal respiratory effort, no problems with respiration noted  Abdomen: Soft, gravid, appropriate for gestational age.  Pain/Pressure: Present     Pelvic: Cervical exam performed in the presence of a chaperone Dilation: 1 Effacement (%): Thick Station: Ballotable  Extremities: Normal range of motion.  Edema: None  Mental Status: Normal mood and affect. Normal behavior. Normal judgment and thought content.   Assessment and Plan:  Pregnancy: [id number] at [redacted]w[redacted]d 1. [redacted] weeks gestation of pregnancy 2. Supervision of other normal pregnancy, antepartum Pelvic cultures done today.  - Strep Gp B NAA - GC/Chlamydia probe amp ()not at [facility] Labor symptoms and general  obstetric precautions including but not limited to vaginal bleeding, contractions, leaking of fluid and fetal movement were reviewed in detail with the patient. Please refer to After Visit Summary for other counseling recommendations.   Return in about 1 week (around 03/02/2020) for OFFICE OB VISIT (MD or APP).  Future Appointments  Date Time Provider Department Center  03/02/2020  3:45 PM Federico Flake, MD [facility]  03/09/2020  3:45 PM Xenia Bing, MD [facility]  03/29/2020  9:30 AM Olivia Mackie, NP [facility]  06/21/2020  1:45 PM Avanell Shackleton, NP-C [facility]    Jaynie Collins, MD

## 2020-02-24 NOTE — Patient Instructions (Signed)
Return to office for any scheduled appointments. Call the office or go to the MAU at [facility] if:  You begin to have strong, frequent contractions  Your water breaks.  Sometimes it is a big gush of fluid, sometimes it is just a trickle that keeps getting your panties wet or running down your legs  You have vaginal bleeding.  It is normal to have a small amount of spotting if your cervix was checked.   You do not feel your baby moving like normal.  If you do not, get something to eat and drink and lay down and focus on feeling your baby move.   If your baby is still not moving like normal, you should call the office or go to MAU.  Any other obstetric concerns.   

## 2020-02-26 LAB — STREP GP B NAA: Strep Gp B NAA: NEGATIVE

## 2020-02-28 LAB — GC/CHLAMYDIA PROBE AMP (~~LOC~~) NOT AT ARMC
Chlamydia: NEGATIVE
Comment: NEGATIVE
Comment: NORMAL
Neisseria Gonorrhea: NEGATIVE

## 2020-03-02 ENCOUNTER — Other Ambulatory Visit: Payer: Self-pay

## 2020-03-02 ENCOUNTER — Ambulatory Visit (INDEPENDENT_AMBULATORY_CARE_PROVIDER_SITE_OTHER): Payer: Medicaid Other | Admitting: Family Medicine

## 2020-03-02 VITALS — BP 110/69 | HR 108 | Wt 226.0 lb

## 2020-03-02 DIAGNOSIS — F419 Anxiety disorder, unspecified: Secondary | ICD-10-CM

## 2020-03-02 DIAGNOSIS — Z3A37 37 weeks gestation of pregnancy: Secondary | ICD-10-CM

## 2020-03-02 DIAGNOSIS — F32A Depression, unspecified: Secondary | ICD-10-CM

## 2020-03-02 DIAGNOSIS — Z3480 Encounter for supervision of other normal pregnancy, unspecified trimester: Secondary | ICD-10-CM

## 2020-03-02 NOTE — Progress Notes (Signed)
   PRENATAL VISIT NOTE  Subjective:  Samaiyah Mctavish is a 29 y.o. A6T0160 at [redacted]w[redacted]d being seen today for ongoing prenatal care.  She is currently monitored for the following issues for this low-risk pregnancy and has Chronic nonintractable headache; Anxiety and depression; Right lower quadrant abdominal pain; Family history of thyroid disease in mother; and Encounter for supervision of normal pregnancy, unspecified, unspecified trimester on their problem list.  Patient reports no complaints.  Contractions: Irritability. Vag. Bleeding: None.  Movement: Present. Denies leaking of fluid.   The following portions of the patient's history were reviewed and updated as appropriate: allergies, current medications, past family history, past medical history, past social history, past surgical history and problem list.   Objective:   Vitals:   03/02/20 1622  BP: 110/69  Pulse: (!) 108  Weight: 226 lb (102.5 kg)    Fetal Status: Fetal Heart Rate (bpm): 145   Movement: Present     General:  Alert, oriented and cooperative. Patient is in no acute distress.  Skin: Skin is warm and dry. No rash noted.   Cardiovascular: Normal heart rate noted  Respiratory: Normal respiratory effort, no problems with respiration noted  Abdomen: Soft, gravid, appropriate for gestational age.  Pain/Pressure: Present     Pelvic: Cervical exam deferred        Extremities: Normal range of motion.  Edema: None  Mental Status: Normal mood and affect. Normal behavior. Normal judgment and thought content.   Assessment and Plan:  Pregnancy: [id number] at [redacted]w[redacted]d 1. Supervision of other normal pregnancy, antepartum Discussed IOL at 41 weeks Discussed sweeping at 39 weeks and patient desires.  Discussed membrane sweeping today. Reviewed cochrane review data on membrane sweeping at 39 wks and then at EDD. Reviewed risk of cramping, contractions, bleeding and ROM. Answered patient questions and she agreed to proceed with  procedure next week  Provided info about [employer] and [employer] daily stretches.    2. Anxiety and depression Stable. Consider 2 week Mychart mood check  3. [redacted] weeks gestation of pregnancy   Term labor symptoms and general obstetric precautions including but not limited to vaginal bleeding, contractions, leaking of fluid and fetal movement were reviewed in detail with the patient. Please refer to After Visit Summary for other counseling recommendations.   Return in about 1 week (around 03/09/2020) for Routine prenatal care, MD or APP.  Future Appointments  Date Time Provider Department Center  03/13/2020  3:15 PM Tereso Newcomer, MD [facility]  03/29/2020  9:30 AM Olivia Mackie, NP [facility] None  06/21/2020  1:45 PM Avanell Shackleton, NP-C [facility]    Federico Flake, MD

## 2020-03-04 NOTE — L&D Delivery Note (Signed)
Delivery Note Called by RN, walked to room and infant was completely out and crying. At 8:31 PM a viable female was delivered via Vaginal, Spontaneous (Presentation: Left Occiput Anterior).  APGAR: 3, 3; weight pending. Infant initially cried and was well appearing so decision was made to proceed with delayed cord clamping. Cord was clamped and cut by FOB and baby was then transferred to the warmer after being noted to be apneic after birth and did not respond to tactile stim and suctioning. Cord gas was collected, but insufficient. NICU team was called. Placenta status: Spontaneous, Intact.  Cord: 3 vessels with the following complications: None.  Anesthesia: Epidural Episiotomy: None Lacerations: 1st degree, hemostatic without repair Est. Blood Loss (mL): 50  Mom to postpartum.  Baby to Couplet care / Skin to Skin.  Alric Seton 03/09/2020, 9:33 PM

## 2020-03-08 ENCOUNTER — Encounter (HOSPITAL_COMMUNITY): Payer: Self-pay | Admitting: Obstetrics and Gynecology

## 2020-03-08 ENCOUNTER — Inpatient Hospital Stay (HOSPITAL_COMMUNITY)
Admission: AD | Admit: 2020-03-08 | Discharge: 2020-03-10 | DRG: 807 | Disposition: A | Discharge status: Home / Self Care | Payer: Medicaid Other | Attending: Family Medicine | Admitting: Family Medicine

## 2020-03-08 ENCOUNTER — Inpatient Hospital Stay (HOSPITAL_COMMUNITY): Payer: Medicaid Other | Admitting: Anesthesiology

## 2020-03-08 ENCOUNTER — Ambulatory Visit (INDEPENDENT_AMBULATORY_CARE_PROVIDER_SITE_OTHER): Payer: Medicaid Other | Admitting: Advanced Practice Midwife

## 2020-03-08 ENCOUNTER — Other Ambulatory Visit: Payer: Self-pay

## 2020-03-08 VITALS — BP 103/72 | HR 108 | Wt 225.0 lb

## 2020-03-08 DIAGNOSIS — O99344 Other mental disorders complicating childbirth: Secondary | ICD-10-CM | POA: Diagnosis present

## 2020-03-08 DIAGNOSIS — F32A Depression, unspecified: Secondary | ICD-10-CM | POA: Diagnosis present

## 2020-03-08 DIAGNOSIS — O99214 Obesity complicating childbirth: Secondary | ICD-10-CM | POA: Diagnosis present

## 2020-03-08 DIAGNOSIS — O4292 Full-term premature rupture of membranes, unspecified as to length of time between rupture and onset of labor: Secondary | ICD-10-CM

## 2020-03-08 DIAGNOSIS — O4290 Premature rupture of membranes, unspecified as to length of time between rupture and onset of labor, unspecified weeks of gestation: Secondary | ICD-10-CM | POA: Diagnosis present

## 2020-03-08 DIAGNOSIS — Z3A38 38 weeks gestation of pregnancy: Secondary | ICD-10-CM | POA: Diagnosis not present

## 2020-03-08 DIAGNOSIS — Z20822 Contact with and (suspected) exposure to covid-19: Secondary | ICD-10-CM | POA: Diagnosis present

## 2020-03-08 DIAGNOSIS — Z3480 Encounter for supervision of other normal pregnancy, unspecified trimester: Principal | ICD-10-CM

## 2020-03-08 DIAGNOSIS — F419 Anxiety disorder, unspecified: Secondary | ICD-10-CM | POA: Diagnosis present

## 2020-03-08 DIAGNOSIS — Z87891 Personal history of nicotine dependence: Secondary | ICD-10-CM | POA: Diagnosis not present

## 2020-03-08 DIAGNOSIS — E669 Obesity, unspecified: Secondary | ICD-10-CM | POA: Diagnosis present

## 2020-03-08 DIAGNOSIS — Z3490 Encounter for supervision of normal pregnancy, unspecified, unspecified trimester: Secondary | ICD-10-CM

## 2020-03-08 LAB — CBC
HCT: 35 % — ABNORMAL LOW (ref 36.0–46.0)
Hemoglobin: 11.8 g/dL — ABNORMAL LOW (ref 12.0–15.0)
MCH: 33.7 pg (ref 26.0–34.0)
MCHC: 33.7 g/dL (ref 30.0–36.0)
MCV: 100 fL (ref 80.0–100.0)
Platelets: 137 10*3/uL — ABNORMAL LOW (ref 150–400)
RBC: 3.5 MIL/uL — ABNORMAL LOW (ref 3.87–5.11)
RDW: 13 % (ref 11.5–15.5)
WBC: 5.3 10*3/uL (ref 4.0–10.5)
nRBC: 0 % (ref 0.0–0.2)

## 2020-03-08 LAB — TYPE AND SCREEN
ABO/RH(D): O POS
Antibody Screen: NEGATIVE

## 2020-03-08 LAB — RESP PANEL BY RT-PCR (FLU A&B, COVID) ARPGX2
Influenza A by PCR: NEGATIVE
Influenza B by PCR: NEGATIVE
SARS Coronavirus 2 by RT PCR: NEGATIVE

## 2020-03-08 MED ORDER — FENTANYL CITRATE (PF) 100 MCG/2ML IJ SOLN
100.0000 ug | INTRAMUSCULAR | Status: DC | PRN
  Administered 2020-03-08 (×2): 100 ug via INTRAVENOUS
  Filled 2020-03-08: qty 2

## 2020-03-08 MED ORDER — TERBUTALINE SULFATE 1 MG/ML IJ SOLN: 0.2500 mg | Freq: Once | INTRAMUSCULAR | Status: DC | PRN

## 2020-03-08 MED ORDER — DIPHENHYDRAMINE HCL 50 MG/ML IJ SOLN: 12.5000 mg | INTRAMUSCULAR | Status: DC | PRN

## 2020-03-08 MED ORDER — OXYTOCIN-SODIUM CHLORIDE 30-0.9 UT/500ML-% IV SOLN
1.0000 m[IU]/min | INTRAVENOUS | Status: DC
  Administered 2020-03-08: 2 m[IU]/min via INTRAVENOUS

## 2020-03-08 MED ORDER — EPHEDRINE 5 MG/ML INJ: 10.0000 mg | INTRAVENOUS | Status: DC | PRN

## 2020-03-08 MED ORDER — OXYTOCIN BOLUS FROM INFUSION
333.0000 mL | Freq: Once | INTRAVENOUS | Status: AC
Start: 2020-03-08 — End: 2020-03-09
  Administered 2020-03-09: 333 mL via INTRAVENOUS

## 2020-03-08 MED ORDER — OXYTOCIN-SODIUM CHLORIDE 30-0.9 UT/500ML-% IV SOLN
2.5000 [IU]/h | INTRAVENOUS | Status: DC
  Filled 2020-03-08 (×2): qty 500

## 2020-03-08 MED ORDER — SOD CITRATE-CITRIC ACID 500-334 MG/5ML PO SOLN: 30.0000 mL | ORAL | Status: DC | PRN

## 2020-03-08 MED ORDER — SERTRALINE HCL 50 MG PO TABS
50.0000 mg | ORAL_TABLET | Freq: Every day | ORAL | Status: DC
  Administered 2020-03-10: 50 mg via ORAL
  Filled 2020-03-08 (×2): qty 1

## 2020-03-08 MED ORDER — LIDOCAINE HCL (PF) 1 % IJ SOLN
INTRAMUSCULAR | Status: DC | PRN
  Administered 2020-03-08 (×2): 4 mL via EPIDURAL

## 2020-03-08 MED ORDER — SODIUM CHLORIDE (PF) 0.9 % IJ SOLN
INTRAMUSCULAR | Status: DC | PRN
  Administered 2020-03-08: 12 mL/h via EPIDURAL

## 2020-03-08 MED ORDER — PHENYLEPHRINE 40 MCG/ML (10ML) SYRINGE FOR IV PUSH (FOR BLOOD PRESSURE SUPPORT)
80.0000 ug | PREFILLED_SYRINGE | INTRAVENOUS | Status: DC | PRN
  Filled 2020-03-08: qty 10

## 2020-03-08 MED ORDER — PHENYLEPHRINE 40 MCG/ML (10ML) SYRINGE FOR IV PUSH (FOR BLOOD PRESSURE SUPPORT)
80.0000 ug | PREFILLED_SYRINGE | INTRAVENOUS | Status: DC | PRN
  Administered 2020-03-08: 80 ug via INTRAVENOUS

## 2020-03-08 MED ORDER — ACETAMINOPHEN 325 MG PO TABS: 650.0000 mg | ORAL_TABLET | ORAL | Status: DC | PRN

## 2020-03-08 MED ORDER — LACTATED RINGERS IV SOLN: 500.0000 mL | Freq: Once | INTRAVENOUS | Status: DC

## 2020-03-08 MED ORDER — LIDOCAINE HCL (PF) 1 % IJ SOLN: 30.0000 mL | INTRAMUSCULAR | Status: DC | PRN

## 2020-03-08 MED ORDER — FENTANYL-BUPIVACAINE-NACL 0.5-0.125-0.9 MG/250ML-% EP SOLN
12.0000 mL/h | EPIDURAL | Status: DC | PRN
Start: 2020-03-08 — End: 2020-03-09
  Administered 2020-03-09: 12 mL/h via EPIDURAL
  Filled 2020-03-08 (×2): qty 250

## 2020-03-08 MED ORDER — LACTATED RINGERS IV SOLN: INTRAVENOUS | Status: DC

## 2020-03-08 MED ORDER — ONDANSETRON HCL 4 MG/2ML IJ SOLN
4.0000 mg | Freq: Four times a day (QID) | INTRAMUSCULAR | Status: DC | PRN
Start: 2020-03-08 — End: 2020-03-09
  Administered 2020-03-08: 4 mg via INTRAVENOUS
  Filled 2020-03-08: qty 2

## 2020-03-08 MED ORDER — FENTANYL CITRATE (PF) 100 MCG/2ML IJ SOLN
INTRAMUSCULAR | Status: AC
  Filled 2020-03-08: qty 2

## 2020-03-08 MED ORDER — LACTATED RINGERS IV SOLN
500.0000 mL | INTRAVENOUS | Status: DC | PRN
  Administered 2020-03-09: 1000 mL via INTRAVENOUS

## 2020-03-08 MED ORDER — MISOPROSTOL 50MCG HALF TABLET
50.0000 ug | ORAL_TABLET | ORAL | Status: DC | PRN
  Administered 2020-03-08: 50 ug via BUCCAL
  Filled 2020-03-08: qty 1

## 2020-03-08 NOTE — Progress Notes (Signed)
None      S: Ms. Selena Malone is a 30 y.o. [phone] at [redacted]w[redacted]d  who presents to MAU today complaining of leaking of fluid since midnight. She denies continuous LOF. She denies vaginal bleeding. She denies contractions. She reports normal fetal movement.    O: BP 103/72   Pulse (!) 108   Wt 225 lb (102.1 kg)   LMP 05/17/2019 (Approximate)   BMI 38.62 kg/m  GENERAL: Well-developed, well-nourished female in no acute distress.  HEAD: Normocephalic, atraumatic.  CHEST: Normal effort of breathing, regular heart rate ABDOMEN: Soft, nontender, gravid PELVIC: Normal external female genitalia. Vagina is pink and rugated. Cervix with normal contour, no lesions. Normal discharge.     Cervical exam: 1/long/ballotable very anterior (12 o clock directly behind pubic bone).      A: SIUP at [redacted]w[redacted]d  FHT 143 by Doppler Fern positive in office Cephalic confirmed with ultrasound  P: ROM at midnight this morning GBS neg Admit to L&D Report called to L&D team, orders placed for 50 mcg buccal Cytotec L&D charge Shanda Bumps notified of intent to direct admit, will call me back for report Patient advised to report directly to L&D for IOL in setting of confirmed ROM  Clayton Bibles, MSN, CNM Certified Nurse Midwife, [employer] for [employer], [facility] 03/08/20 9:10 AM   03/08/2020 9:08 AM

## 2020-03-08 NOTE — Anesthesia Preprocedure Evaluation (Signed)
Anesthesia Evaluation  Patient identified by MRN, date of birth, ID band Patient awake    Reviewed: Allergy & Precautions, Patient's Chart, lab work & pertinent test results  Airway Mallampati: II  TM Distance: >3 FB Neck ROM: Full    Dental no notable dental hx. (+) Teeth Intact   Pulmonary former smoker,    Pulmonary exam normal breath sounds clear to auscultation       Cardiovascular negative cardio ROS Normal cardiovascular exam Rhythm:Regular Rate:Normal     Neuro/Psych  Headaches, PSYCHIATRIC DISORDERS Anxiety Depression    GI/Hepatic Neg liver ROS, GERD  ,  Endo/Other  Morbid obesity  Renal/GU negative Renal ROS  negative genitourinary   Musculoskeletal negative musculoskeletal ROS (+)   Abdominal (+) + obese,   Peds  Hematology  (+) anemia ,   Anesthesia Other Findings   Reproductive/Obstetrics (+) Pregnancy SPROM                             Anesthesia Physical Anesthesia Plan  ASA: III  Anesthesia Plan: Epidural   Post-op Pain Management:    Induction:   PONV Risk Score and Plan:   Airway Management Planned: Natural Airway  Additional Equipment:   Intra-op Plan:   Post-operative Plan:   Informed Consent: I have reviewed the patients History and Physical, chart, labs and discussed the procedure including the risks, benefits and alternatives for the proposed anesthesia with the patient or authorized representative who has indicated his/her understanding and acceptance.       Plan Discussed with: CRNA and Anesthesiologist  Anesthesia Plan Comments:         Anesthesia Quick Evaluation

## 2020-03-08 NOTE — Progress Notes (Signed)
Patient ID: Selena Malone, female   DOB: 1990-11-24, 30 y.o.   MRN: [id number]  In to introduce myself to pt/spouse; feeling some cramping/ctx; cervical foley still in place and s/p cytotec x 1 dose  BP 117/73, P 80, T 98 FHR 120-130s, +accels, no decels Ctx irreg Cx deferred  IUP@38 .5wks PROM x 20h- afebrile Cx unfavorable  Plan for Pit when foley comes out Anticipate vag del  Arabella Merles CNM 03/08/2020

## 2020-03-08 NOTE — Progress Notes (Signed)
Labor Progress Note Selena Malone is a 30 y.o. [id number] at [redacted]w[redacted]d presented for PROM 0000 on 03/09/19, fern+ in clinic.  S: Doing well without complaints.  O:  BP 116/68   Pulse 94   Ht 5\' 3"  (1.6 m)   Wt 102.5 kg   LMP 05/17/2019 (Approximate)   SpO2 100%   BMI 40.02 kg/m  EFM: baseline 140bpm/mod variability/+ accels/no decels Toco: q1 minute  CVE: Dilation: 1.5 Effacement (%): 50 Station: -3 Presentation: Vertex Exam by:: J.Cox, RN   A&P: 30 y.o. 34 [redacted]w[redacted]d presented for PROM 0000 on 03/09/19, fern+ in clinic. #PROM: S/p cytotec x1. FB placed without difficulty. Plan to re-dose cytotec if contractions space out, otherwise will start pitocin when FB dislodged. #Pain: PRN, desires epidural #FWB: cat 1 #GBS negative #Anxiety/depression: continue home zoloft 50mg  daily. SW postpartum.  05/07/19, MD 5:06 PM

## 2020-03-08 NOTE — Patient Instructions (Signed)

## 2020-03-08 NOTE — Progress Notes (Signed)
Leaking fluid since midnight

## 2020-03-08 NOTE — Anesthesia Procedure Notes (Signed)
Epidural Patient location during procedure: OB Start time: 03/08/2020 9:30 PM End time: 03/08/2020 9:38 PM  Staffing Anesthesiologist: Mal Amabile, MD Performed: anesthesiologist   Preanesthetic Checklist Completed: patient identified, IV checked, site marked, risks and benefits discussed, surgical consent, monitors and equipment checked, pre-op evaluation and timeout performed  Epidural Patient position: sitting Prep: DuraPrep and site prepped and draped Patient monitoring: continuous pulse ox and blood pressure Approach: midline Location: L3-L4 Injection technique: LOR air  Needle:  Needle type: Tuohy  Needle gauge: 17 G Needle length: 9 cm and 9 Needle insertion depth: 6 cm Catheter type: closed end flexible Catheter size: 19 Gauge Catheter at skin depth: 11 cm Test dose: negative and Other  Assessment Events: blood not aspirated, injection not painful, no injection resistance, no paresthesia and negative IV test  Additional Notes Patient identified. Risks and benefits discussed including failed block, incomplete  Pain control, post dural puncture headache, nerve damage, paralysis, blood pressure Changes, nausea, vomiting, reactions to medications-both toxic and allergic and post Partum back pain. All questions were answered. Patient expressed understanding and wished to proceed. Sterile technique was used throughout procedure. Epidural site was Dressed with sterile barrier dressing. No paresthesias, signs of intravascular injection Or signs of intrathecal spread were encountered.  Patient was more comfortable after the epidural was dosed. Please see RN's note for documentation of vital signs and FHR which are stable. Reason for block:procedure for pain

## 2020-03-08 NOTE — H&P (Addendum)
OBSTETRIC ADMISSION HISTORY AND PHYSICAL  Selena Malone is a 30 y.o. female [phone] with IUP at [redacted]w[redacted]d by 20 wk [place] presenting for PROM @0000  on 03/09/19. She had LOF starting at midnight last night, and confirmed fern positive in office at [facility]. She reports +FMs, no VB, no blurry vision, headaches or peripheral edema, and RUQ pain.  She plans on bottle feeding. She requests postpartum IUD for birth control. She received her prenatal care at [facility]   Dating: By [place] ([redacted]w[redacted]d 11/03/19)--->  Estimated Date of Delivery: 03/17/20  Sono:    @[redacted]w[redacted]d , established dates, incomplete anatomy, breech presentation, anterior placental lie, 372g, 45% EFW  @[redacted]w[redacted]d , CWD, cephalic presentation, anterior placental lie, 702g, 30% EFW  Prenatal History/Complications: PROM Obesity pre-gravid BMI 37 Uncertain dates- confirmed by [redacted]w[redacted]d [place], EDD 03/17/20 Anxiety/depression (on zoloft)  Past Medical History: Past Medical History:  Diagnosis Date  . Anxiety and depression   . Chronic headaches   . Iron deficiency anemia   . Low blood hemoglobin A2 08/11/2017   Negative alpha and beta thal on horizon testing.     Past Surgical History: Past Surgical History:  Procedure Laterality Date  . TONSILLECTOMY      Obstetrical History: OB History    Gravida  4   Para  2   Term  2   Preterm  0   AB  1   Living  2     SAB  0   IAB  1   Ectopic  0   Multiple  0   Live Births  2           Social History: Social History   Socioeconomic History  . Marital status: Married    Spouse name: Not on file  . Number of children: Not on file  . Years of education: Not on file  . Highest education level: Not on file  Occupational History  . Not on file  Tobacco Use  . Smoking status: Former Smoker    Packs/day: 0.40    Years: 10.00    Pack years: 4.00    Quit date: 03/23/2017    Years since quitting: 2.9  . Smokeless tobacco: Never Used  Vaping Use  . Vaping Use: Never used  Substance  and Sexual Activity  . Alcohol use: Not Currently  . Drug use: Not Currently  . Sexual activity: Yes    Partners: Male    Birth control/protection: None    Comment: 1st intercourse-13, partners- 7, married-3 yrs,   Other Topics Concern  . Not on file  Social History Narrative   ** Merged History Encounter **       Social Determinants of Health   Financial Resource Strain: Not on file  Food Insecurity: Not on file  Transportation Needs: Not on file  Physical Activity: Not on file  Stress: Not on file  Social Connections: Not on file    Family History: Family History  Problem Relation Age of Onset  . Graves' disease Mother   . Migraines Mother   . Cancer Paternal Aunt   . Thyroid disease Paternal Aunt   . Cancer Maternal Grandmother   . Depression Maternal Grandfather   . Cancer Paternal Grandfather     Allergies: Allergies  Allergen Reactions  . Codeine Itching    Medications Prior to Admission  Medication Sig Dispense Refill Last Dose  . azithromycin (ZITHROMAX) 250 MG tablet Take 1 tablet (250 mg total) by mouth daily. Take first 2 tablets together, then  1 every day until finished. 6 tablet 0   . Homeopathic Products (ALLERGY MEDICINE PO) Take by mouth.      . Prenatal Vit w/Fe-Methylfol-FA (PNV PO) Take by mouth.     . sertraline (ZOLOFT) 50 MG tablet Take 1 tablet (50 mg total) by mouth daily. 60 tablet 1      Review of Systems   All systems reviewed and negative except as stated in HPI  Blood pressure 106/68, pulse (!) 110, height 5\' 3"  (1.6 m), weight 102.5 kg, last menstrual period 05/17/2019. General appearance: alert, cooperative, appears stated age and no distress Lungs: normal effort Heart: regular rate  Abdomen: soft, non-tender; gravid Pelvic: gravid uterus GU: No vaginal lesions  Extremities: Homans sign is negative, no sign of DVT Presentation: cephalic by BSUS Fetal monitoringBaseline: 140 bpm, Variability: Good {> 6 bpm), Accelerations:  Reactive and Decelerations: Absent Uterine activity: None     Prenatal labs: ABO, Rh: --/--/O POS (01/05 1110) Antibody: NEG (01/05 1110) Rubella: 1.54 (07/02 1105) RPR: Non Reactive (10/21 0858)  HBsAg: Negative (07/02 1105)  HIV: Non Reactive (10/21 0859)  GBS: Negative/-- (12/23 1408)  2 hr Glucola passed Genetic screening NIPS LR female, AFP neg Anatomy 03-31-1973 nml but limited, f/u nml  Prenatal Transfer Tool  Maternal Diabetes: No Genetic Screening: Normal Maternal Ultrasounds/Referrals: Normal Fetal Ultrasounds or other Referrals:  Referred to Materal Fetal Medicine  Maternal Substance Abuse:  No Significant Maternal Medications:  Meds include: Zoloft Significant Maternal Lab Results: Group B Strep negative  Results for orders placed or performed during the hospital encounter of 03/08/20 (from the past 24 hour(s))  CBC   Collection Time: 03/08/20 10:46 AM  Result Value Ref Range   WBC 5.3 4.0 - 10.5 K/uL   RBC 3.50 (L) 3.87 - 5.11 MIL/uL   Hemoglobin 11.8 (L) 12.0 - 15.0 g/dL   HCT 05/06/20 (L) 44.3 - 15.4 %   MCV 100.0 80.0 - 100.0 fL   MCH 33.7 26.0 - 34.0 pg   MCHC 33.7 30.0 - 36.0 g/dL   RDW 00.8 67.6 - 19.5 %   Platelets 137 (L) 150 - 400 K/uL   nRBC 0.0 0.0 - 0.2 %  Type and screen   Collection Time: 03/08/20 11:10 AM  Result Value Ref Range   ABO/RH(D) O POS    Antibody Screen NEG    Sample Expiration      03/11/2020,2359 Performed at [facility], [address], [place], [place]     Patient Active Problem List   Diagnosis Date Noted  . PROM (premature rupture of membranes) 03/08/2020  . Encounter for supervision of normal pregnancy, unspecified, unspecified trimester 08/27/2019  . Family history of thyroid disease in mother 06/16/2019  . Chronic nonintractable headache 05/19/2019  . Anxiety and depression 05/19/2019    Assessment/Plan:  Selena Malone is a 30 y.o. 34 at [redacted]w[redacted]d here for IOL d/t PROM. Patient PROM Midnight  03/08/20 without ctx or cervical change.  #PROM: Patient receiving cytotec x1 now, she has previously been induced successfully with FB and pitocin. She prefers not to have FB right now, but may consent if no progress with cytotec. She is agreeable to pitocin use as clinically indicated.   #Pain: PRN #FWB:  Cat I; EFW: 7# by leopold #ID:  GBS neg #MOF: Bottle #MOC: outpatient IUD #Circ:  yes #Uncertain dates: Patient's anatomy scan sizing did not match dating by LMP and her early 05/06/20 did not correctly measure CRL. Final dating  established by anatomy scan at [redacted]w[redacted]d on 11/03/19 with EDD as 03/17/20.  #Depression: Treated with zoloft, ordered. SW postpartum  Shirlean Mylar, MD [facility], PGY-2 Center for [facility], [facility] 03/08/2020, 12:41 PM  GME ATTESTATION:  I saw and evaluated the patient. I agree with the findings and the plan of care as documented in the resident's note.  Alric Seton, MD OB Fellow, Faculty [facility], Center for [facility] 03/08/2020 2:55 PM

## 2020-03-09 ENCOUNTER — Encounter (HOSPITAL_COMMUNITY): Payer: Self-pay | Admitting: Obstetrics and Gynecology

## 2020-03-09 ENCOUNTER — Encounter: Payer: Medicaid Other | Admitting: Obstetrics and Gynecology

## 2020-03-09 DIAGNOSIS — Z3A38 38 weeks gestation of pregnancy: Secondary | ICD-10-CM

## 2020-03-09 LAB — SYPHILIS: RPR W/REFLEX TO RPR TITER AND TREPONEMAL ANTIBODIES, TRADITIONAL SCREENING AND DIAGNOSIS ALGORITHM: RPR Ser Ql: NONREACTIVE

## 2020-03-09 MED ORDER — FENTANYL CITRATE (PF) 100 MCG/2ML IJ SOLN
INTRAMUSCULAR | Status: AC
  Filled 2020-03-09: qty 2

## 2020-03-09 MED ORDER — LIDOCAINE HCL (PF) 2 % IJ SOLN
INTRAMUSCULAR | Status: DC | PRN
  Administered 2020-03-09 (×2): 100 mg via INTRADERMAL

## 2020-03-09 MED ORDER — BENZOCAINE-MENTHOL 20-0.5 % EX AERO
1.0000 "application " | INHALATION_SPRAY | CUTANEOUS | Status: DC | PRN
  Administered 2020-03-09: 1 via TOPICAL
  Filled 2020-03-09: qty 56

## 2020-03-09 MED ORDER — WITCH HAZEL-GLYCERIN EX PADS: 1.0000 "application " | MEDICATED_PAD | CUTANEOUS | Status: DC | PRN

## 2020-03-09 MED ORDER — TETANUS-DIPHTH-ACELL PERTUSSIS 5-2.5-18.5 LF-MCG/0.5 IM SUSY: 0.5000 mL | PREFILLED_SYRINGE | Freq: Once | INTRAMUSCULAR | Status: DC

## 2020-03-09 MED ORDER — LACTATED RINGERS AMNIOINFUSION: INTRAVENOUS | Status: DC

## 2020-03-09 MED ORDER — IBUPROFEN 600 MG PO TABS
600.0000 mg | ORAL_TABLET | Freq: Four times a day (QID) | ORAL | Status: DC
  Administered 2020-03-09 – 2020-03-10 (×3): 600 mg via ORAL
  Filled 2020-03-09 (×3): qty 1

## 2020-03-09 MED ORDER — DIBUCAINE (PERIANAL) 1 % EX OINT: 1.0000 "application " | TOPICAL_OINTMENT | CUTANEOUS | Status: DC | PRN

## 2020-03-09 MED ORDER — ONDANSETRON HCL 4 MG PO TABS: 4.0000 mg | ORAL_TABLET | ORAL | Status: DC | PRN

## 2020-03-09 MED ORDER — SENNOSIDES-DOCUSATE SODIUM 8.6-50 MG PO TABS
2.0000 | ORAL_TABLET | ORAL | Status: DC
  Administered 2020-03-10: 2 via ORAL
  Filled 2020-03-09: qty 2

## 2020-03-09 MED ORDER — COCONUT OIL OIL: 1.0000 "application " | TOPICAL_OIL | Status: DC | PRN

## 2020-03-09 MED ORDER — FENTANYL CITRATE (PF) 100 MCG/2ML IJ SOLN
INTRAMUSCULAR | Status: DC | PRN
  Administered 2020-03-09: 100 ug via EPIDURAL

## 2020-03-09 MED ORDER — MEASLES, MUMPS & RUBELLA VAC IJ SOLR: 0.5000 mL | Freq: Once | INTRAMUSCULAR | Status: DC

## 2020-03-09 MED ORDER — DIPHENHYDRAMINE HCL 25 MG PO CAPS: 25.0000 mg | ORAL_CAPSULE | Freq: Four times a day (QID) | ORAL | Status: DC | PRN

## 2020-03-09 MED ORDER — PRENATAL MULTIVITAMIN CH
1.0000 | ORAL_TABLET | Freq: Every day | ORAL | Status: DC
  Filled 2020-03-09: qty 1

## 2020-03-09 MED ORDER — SIMETHICONE 80 MG PO CHEW: 80.0000 mg | CHEWABLE_TABLET | ORAL | Status: DC | PRN

## 2020-03-09 MED ORDER — ACETAMINOPHEN 325 MG PO TABS: 650.0000 mg | ORAL_TABLET | ORAL | Status: DC | PRN

## 2020-03-09 MED ORDER — ONDANSETRON HCL 4 MG/2ML IJ SOLN: 4.0000 mg | INTRAMUSCULAR | Status: DC | PRN

## 2020-03-09 NOTE — Progress Notes (Signed)
Labor Progress Note Selena Malone is a 30 y.o. [id number] at [redacted]w[redacted]d presented for PROM at term  S:  Feeling more uncomfortable with ctx, PCEA used, not helping, s/p anesthesia redose.  O:  BP (!) 106/53 (BP Location: Right Arm)   Pulse 74   Temp 98.1 F (36.7 C) (Oral)   Resp 20   Ht 5\' 3"  (1.6 m)   Wt 102.5 kg   LMP 05/17/2019 (Approximate)   SpO2 100%   BMI 40.02 kg/m  EFM: baseline 155 bpm/ mod variability/ + accels/ variable decels  Toco/IUPC: 2-4 SVE: Dilation: 8 Effacement (%): 80 Cervical Position: Anterior Station: -1 Presentation: Vertex Exam by:: [email or web address], CNM Pitocin: off  A/P: 30 y.o. 34 [redacted]w[redacted]d  1. Labor: active 2. FWB: Cat II 3. Pain: epidural 4. Prolonged ROM: stable  FHR with recurrent variables, Pitocin off, pt repositioned. Likely d/t rapid cervical progress. Will start amnioinfusion. Anticipate rapid progress and SVD.  [redacted]w[redacted]d, CNM 3:04 PM

## 2020-03-09 NOTE — Discharge Summary (Shared)
Postpartum Discharge Summary  Date of Service updated-yes     Patient Name: Selena Malone DOB: 09/13/1990 MRN: [id number]  Date of admission: 03/08/2020 Delivery date:03/09/2020  Delivering provider: Arrie Senate  Date of discharge: 03/10/2020  Admitting diagnosis: Full-term premature rupture of membranes [O42.92] Intrauterine pregnancy: [redacted]w[redacted]d    Secondary diagnosis:  Active Problems:   Vaginal delivery   Anxiety and depression   Encounter for supervision of normal pregnancy, unspecified, unspecified trimester   PROM (premature rupture of membranes)  Additional problems: none    Discharge diagnosis: Term Pregnancy Delivered                                              Post partum procedures:none Augmentation: AROM, Pitocin, Cytotec and IP Foley Complications: None  Hospital course: Induction of Labor With Vaginal Delivery   30y.o. yo [id number] admitted to the hospital 03/08/2020 for induction of labor.  Indication for induction: PROM.  Patient had an uncomplicated labor course as follows: Membrane Rupture Time/Date: 12:00 AM ,03/08/2020   Delivery Method:Vaginal, Spontaneous  Episiotomy: None  Lacerations:  1st degree  Details of delivery can be found in separate delivery note.  Patient had a routine postpartum course. Patient is discharged home 03/10/20.  Newborn Data: Birth date:03/09/2020  Birth time:8:31 PM  Gender:Female  Living status:Living  Apgars:3 ,3  Weight:3501 g   Magnesium Sulfate received: No BMZ received: No Rhophylac:N/A MMR:N/A T-DaP:Given prenatally Flu: Yes Transfusion:No  Physical exam  Vitals:   03/10/20 0008 03/10/20 0440 03/10/20 0830 03/10/20 1250  BP: 118/65 100/65 (!) 99/59 116/69  Pulse: 93 75 74 74  Resp:   18   Temp: 99 F (37.2 C) 97.7 F (36.5 C) 97.9 F (36.6 C) 98.2 F (36.8 C)  TempSrc: Oral Oral Oral Axillary  SpO2:   100% 100%  Weight:      Height:       General: alert, cooperative and no  distress Lochia: appropriate Uterine Fundus: firm Incision: N/A DVT Evaluation: No evidence of DVT seen on physical exam. Negative Homan's sign. No cords or calf tenderness. No significant calf/ankle edema. Labs: Lab Results  Component Value Date   WBC 5.3 03/08/2020   HGB 11.8 (L) 03/08/2020   HCT 35.0 (L) 03/08/2020   MCV 100.0 03/08/2020   PLT 137 (L) 03/08/2020   CMP Latest Ref Rng & Units 09/03/2019  Glucose 65 - 99 mg/dL 88  BUN 6 - 20 mg/dL 7  Creatinine 0.57 - 1.00 mg/dL 0.69  Sodium 134 - 144 mmol/L 139  Potassium 3.5 - 5.2 mmol/L 4.3  Chloride 96 - 106 mmol/L 104  CO2 20 - 29 mmol/L 21  Calcium 8.7 - 10.2 mg/dL 9.4  Total Protein 6.0 - 8.5 g/dL 6.3  Total Bilirubin 0.0 - 1.2 mg/dL <0.2  Alkaline Phos 48 - 121 IU/L 96  AST 0 - 40 IU/L 14  ALT 0 - 32 IU/L 15   Edinburgh Score: Edinburgh Postnatal Depression Scale Screening Tool 03/10/2020  I have been able to laugh and see the funny side of things. 0  I have looked forward with enjoyment to things. 0  I have blamed myself unnecessarily when things went wrong. 1  I have been anxious or worried for no good reason. 2  I have felt scared or panicky for no good reason. 1  Things have been getting on top of me. 1  I have been so unhappy that I have had difficulty sleeping. 0  I have felt sad or miserable. 0  I have been so unhappy that I have been crying. 0  The thought of harming myself has occurred to me. 0  Edinburgh Postnatal Depression Scale Total 5     After visit meds:  Allergies as of 03/10/2020      Reactions   Codeine Itching      Medication List    STOP taking these medications   ALLERGY MEDICINE PO   azithromycin 250 MG tablet Commonly known as: ZITHROMAX   PNV PO     TAKE these medications   sertraline 50 MG tablet Commonly known as: Zoloft Take 1 tablet (50 mg total) by mouth daily.        Discharge home in stable condition Infant Feeding: Bottle Infant Disposition:home with  mother Discharge instruction: per After Visit Summary and Postpartum booklet. Activity: Advance as tolerated. Pelvic rest for 6 weeks.  Diet: routine diet Future Appointments: Future Appointments  Date Time Provider [place]  03/29/2020  9:30 AM Tamela Gammon, NP [facility] None  04/19/2020  1:10 PM Darlina Rumpf, CNM [facility]  06/21/2020  1:45 PM Girtha Rm, NP-C [facility]   Follow up Visit:  [place] [facility] [place] [facility]. Schedule an appointment as soon as possible for a visit in 6 week(s).   Specialty: Obstetrics and Gynecology Contact information: [address] [place] [phone]             Message sent to [facility] by Sylvester Harder 03/10/19.   Please schedule this patient for a In person postpartum visit in 4 weeks with the following provider: Any provider. Additional Postpartum F/U:Postpartum Depression checkup in 2 weeks Low risk pregnancy complicated by: n/a Delivery mode:  Vaginal, Spontaneous  Anticipated Birth Control:  IUD at postpartum visit   03/10/2020 Julianne Handler, CNM

## 2020-03-09 NOTE — Progress Notes (Signed)
Patient ID: Selena Malone, female   DOB: 04-23-1990, 30 y.o.   MRN: [id number]  Comfortable w epidural  BP 105/56, P 98 FHR 130s, +accels, no decels Ctx q 3-4 mins with Pit at 52mu/min Cx 3+/thick/ant/vtx -3; AROM copious clear fluid  IUP@38 .6wks PROM x 28hrs, with sizeable forebag  Now that membranes are fully ruptured, expect her to progress to active labor Anticipate vag del  Arabella Merles [facility] 03/09/2020 7:59 AM

## 2020-03-09 NOTE — Progress Notes (Signed)
Patient ID: Selena Malone, female   DOB: [birth year], 30 y.o.   MRN: [id number]  Comfortable w epidural; s/p cervical foley at around 2230, which is when the Pitocin was started  BP 100/63, P 76 T 98 FHR 130s, +accels, no decels Ctx q 2-3 mins with Pit at 66mu/min Cx deferred (was 3-4/60/-3)  IUP@38 .6wks ROM >24h Cx favorable  Keep ctx reg with Pitocin Plan to check cx in next few hours Anticipate vag del  Selena Malone [facility] 03/09/2020

## 2020-03-09 NOTE — Discharge Instructions (Signed)

## 2020-03-09 NOTE — Progress Notes (Signed)
Labor Progress Note Selena Malone is a 30 y.o. [id number] at [redacted]w[redacted]d presented for PROM at term  S:  Comfortable with epidural. Feels occasional ctx. Denies pressure.   O:  BP 108/60   Pulse 83   Temp 98.6 F (37 C) (Axillary) Comment (Src): pt chewing ice  Resp 18   Ht 5\' 3"  (1.6 m)   Wt 102.5 kg   LMP 05/17/2019 (Approximate)   SpO2 100%   BMI 40.02 kg/m  EFM: baseline 125 bpm/ mod variability/ + accels/ no decels  Toco/IUPC: 2-5 SVE: deferred Pitocin: 24 mu/min  A/P: 30 y.o. 34 [redacted]w[redacted]d  1. Labor: latent 2. FWB: Cat I 3. Pain: epidural 4. Prolonged ROM- no sx of infection  Continue Pitocin titration. Anticipate labor progress and SVD.  [redacted]w[redacted]d, CNM 9:52 AM

## 2020-03-10 NOTE — Progress Notes (Signed)
   03/10/20 0200  Clinical Encounter Type  Visited With Health care provider  Visit Type Code  Referral From Nurse  Consult/Referral To Chaplain  The chaplain responded to a neonatal code blue. Upon arrival the mother and baby were fine. The chaplain will respond as needed.

## 2020-03-10 NOTE — Anesthesia Postprocedure Evaluation (Signed)
Anesthesia Post Note  Patient: Selena Malone  Procedure(s) Performed: AN AD HOC LABOR EPIDURAL     Patient location during evaluation: Mother Baby Anesthesia Type: Epidural Level of consciousness: awake and alert Pain management: pain level controlled Vital Signs Assessment: post-procedure vital signs reviewed and stable Respiratory status: spontaneous breathing, nonlabored ventilation and respiratory function stable Cardiovascular status: stable Postop Assessment: no headache, no backache and epidural receding Anesthetic complications: no   No complications documented.  Last Vitals:  Vitals:   03/10/20 0008 03/10/20 0440  BP: 118/65 100/65  Pulse: 93 75  Resp:    Temp: 37.2 C 36.5 C  SpO2:      Last Pain:  Vitals:   03/10/20 0440  TempSrc: Oral  PainSc: 0-No pain   Pain Goal: Patients Stated Pain Goal: 8 (03/09/20 0831)                 Trellis Paganini

## 2020-03-10 NOTE — Anesthesia Postprocedure Evaluation (Signed)
Anesthesia Post Note  Patient: Selena Malone  Procedure(s) Performed: AN AD HOC LABOR EPIDURAL     Anesthesia Post Evaluation No complications documented.  Last Vitals:  Vitals:   03/10/20 0440 03/10/20 0830  BP: 100/65 (!) 99/59  Pulse: 75 74  Resp:  18  Temp: 36.5 C 36.6 C  SpO2:  100%    Last Pain:  Vitals:   03/10/20 0830  TempSrc: Axillary  PainSc: 0-No pain   Pain Goal: Patients Stated Pain Goal: 8 (03/09/20 0831)              Epidural/Spinal Function Cutaneous sensation: Normal sensation (03/10/20 0830), Patient able to flex knees: Yes (03/10/20 0830), Patient able to lift hips off bed: Yes (03/10/20 0830), Back pain beyond tenderness at insertion site: No (03/10/20 0830), Progressively worsening motor and/or sensory loss: No (03/10/20 0830), Bowel and/or bladder incontinence post epidural: No (03/10/20 0830)  Caren Macadam

## 2020-03-10 NOTE — Progress Notes (Signed)
MOB was referred for history of depression/anxiety. * Referral screened out by Clinical Social Worker because none of the following criteria appear to apply: ~ History of anxiety/depression during this pregnancy, or of post-partum depression following prior delivery. ~ Diagnosis of anxiety and/or depression within last 3 years OR * MOB's symptoms currently being treated with medication and/or therapy. Per further chart review, it is noted that MOB is on Zoloft for anxiety and depression.     CSW aware that MOB scored 5 on Edinburgh with no concerns to CSW.    Jerick Khachatryan S. Jerral Mccauley, MSW, LCSW [facility] at  [phone]  

## 2020-03-10 NOTE — Progress Notes (Signed)
Post Partum Day 1 Subjective:  Patient is doing well without complaints. Ambulating without difficulty. Voiding and passing flatus. Tolerating PO. Abdominal pain improved. Vaginal bleeding decreased.  Objective: Blood pressure 100/65, pulse 75, temperature 97.7 F (36.5 C), temperature source Oral, resp. rate 16, height 5\' 3"  (1.6 m), weight 102.5 kg, last menstrual period 05/17/2019, SpO2 99 %, unknown if currently breastfeeding.  Physical Exam:  General: alert, cooperative and no distress Lochia: appropriate Uterine Fundus: firm Incision: n/a DVT Evaluation: No evidence of DVT seen on physical exam.  Recent Labs    03/08/20 1046  HGB 11.8*  HCT 35.0*    Assessment/Plan: PPD#1 VD  -doing well without complaints, meeting pp milestones  -VSS  -outpatient IUD  -desires circ for baby, consented  -bottle feeding  #anxiety/depression  -continue home zoloft 50mg  daily  -SW consult ordered  Plan for discharge tomorrow given time of delivery.   LOS: 2 days   05/06/20 03/10/2020, 5:16 AM

## 2020-03-13 ENCOUNTER — Encounter: Payer: Medicaid Other | Admitting: Obstetrics & Gynecology

## 2020-03-17 ENCOUNTER — Inpatient Hospital Stay (HOSPITAL_COMMUNITY): Admit: 2020-03-17 | Payer: Self-pay

## 2020-03-26 ENCOUNTER — Other Ambulatory Visit: Payer: Self-pay | Admitting: Family Medicine

## 2020-03-26 DIAGNOSIS — F32A Depression, unspecified: Secondary | ICD-10-CM

## 2020-03-26 DIAGNOSIS — F419 Anxiety disorder, unspecified: Secondary | ICD-10-CM

## 2020-03-29 ENCOUNTER — Encounter: Payer: Medicaid Other | Admitting: Nurse Practitioner

## 2020-03-29 DIAGNOSIS — F32A Depression, unspecified: Secondary | ICD-10-CM

## 2020-03-29 MED ORDER — SERTRALINE HCL 50 MG PO TABS: 50.0000 mg | ORAL_TABLET | Freq: Every day | ORAL | 1 refills | Status: DC

## 2020-04-17 ENCOUNTER — Encounter: Payer: Self-pay | Admitting: Physician Assistant

## 2020-04-17 ENCOUNTER — Other Ambulatory Visit: Payer: Self-pay

## 2020-04-17 ENCOUNTER — Ambulatory Visit (INDEPENDENT_AMBULATORY_CARE_PROVIDER_SITE_OTHER): Payer: Medicaid Other | Admitting: Physician Assistant

## 2020-04-17 VITALS — BP 111/73 | HR 103

## 2020-04-17 DIAGNOSIS — G43009 Migraine without aura, not intractable, without status migrainosus: Secondary | ICD-10-CM

## 2020-04-17 DIAGNOSIS — G4440 Drug-induced headache, not elsewhere classified, not intractable: Secondary | ICD-10-CM | POA: Diagnosis not present

## 2020-04-17 MED ORDER — CYCLOBENZAPRINE HCL 10 MG PO TABS: 10.0000 mg | ORAL_TABLET | Freq: Three times a day (TID) | ORAL | 1 refills | Status: DC | PRN

## 2020-04-17 MED ORDER — SUMATRIPTAN SUCCINATE 100 MG PO TABS: 100.0000 mg | ORAL_TABLET | Freq: Once | ORAL | 3 refills | Status: DC | PRN

## 2020-04-17 MED ORDER — TOPIRAMATE 25 MG PO TABS
75.0000 mg | ORAL_TABLET | Freq: Every day | ORAL | 1 refills | Status: DC
Start: 2020-04-17 — End: 2020-09-22

## 2020-04-17 NOTE — Patient Instructions (Signed)
Analgesic Rebound Headache An analgesic rebound headache, sometimes called a medication overuse headache or a drug-induced headache, is a secondary disorder that is caused by the overuse of pain medicine (analgesic) to treat the original (primary) headache. Any type of primary headache can return as a rebound headache if a person regularly takes analgesics. The types of primary headaches that are commonly associated with rebound headaches include:  Migraines.  Headaches that are caused by tense muscles in the head and neck area (tension headaches).  Headaches that develop and happen again on one side of the head and around the eye (cluster headaches). If rebound headaches continue, they can become long-term, daily headaches. What are the causes? This condition may be caused by frequent use of:  Over-the-counter medicines such as aspirin, ibuprofen, and acetaminophen.  Sinus-relief medicines and medicines that contain caffeine.  Narcotic pain medicines such as codeine and oxycodone.  Some prescription migraine medicines. What are the signs or symptoms? The symptoms of a rebound headache are the same as the symptoms of the original headache. Some of the symptoms of specific types of headaches include: Migraine headache  Pulsing or throbbing pain on one or both sides of the head.  Severe pain that interferes with daily activities.  Pain that gets worse with physical activity.  Nausea, vomiting, or both.  Pain and sensitivity with exposure to bright light, loud noises, or strong smells.  Visual changes.  Numbness of one or both arms. Tension headache  Pressure around the head.  Dull, aching head pain.  Pain felt over the front and sides of the head.  Tenderness in the muscles of the head, neck, and shoulders. Cluster headache  Severe pain that begins in or around one eye or temple.  Droopy or swollen eyelid, or redness and tearing in the eye on the same side as the  pain.  One-sided head pain.  Nausea.  Runny nose.  Sweaty, pale facial skin.  Restlessness. How is this diagnosed? This condition is diagnosed by:  Reviewing your medical history. This includes the nature of your primary headaches.  Reviewing the types of pain medicines that you have been using to treat your primary headaches and how often you take them. How is this treated? This condition may be treated or managed by:  Discontinuing frequent use of the analgesic medicine. Doing this may worsen your headaches at first, but the pain should eventually become more manageable, less frequent, and less severe.  Seeing a headache specialist. He or she may be able to help you manage your headaches and help make sure there is not another cause of the headaches.  Using methods of stress relief, such as acupuncture, counseling, biofeedback, and massage. Talk with your health care provider about which methods might be good for you. Follow these instructions at home: Medicines  Take over-the-counter and prescription medicines only as told by your health care provider.  Stop the repeated use of pain medicine as told by your health care provider. Stopping can be difficult. Carefully follow instructions from your health care provider.   Lifestyle  Follow a regular sleep schedule. Do not vary the time that you go to bed or the amount that you sleep from day to day. It is important to stay on the same schedule to help prevent headaches. Get 7-9 hours of sleep each night, or the amount recommended by your health care provider.  Exercise regularly. Exercise for at least 30 minutes, 5 times each week.  Limit or manage stress. Consider  stress-relief options such as acupuncture, counseling, biofeedback, and massage. Talk with your health care provider about which methods might be good for you.  Do not drink alcohol.  Do not use any products that contain nicotine or tobacco, such as cigarettes,  e-cigarettes, and chewing tobacco. If you need help quitting, ask your health care provider.   General instructions  Avoid triggers that are known to cause your primary headaches.  Keep all follow-up visits as told by your health care provider. This is important. Contact a health care provider if:  You continue to experience headaches after following treatments that your health care provider recommended. Get help right away if you have:  New headache pain.  Headache pain that is different than what you have experienced in the past.  Numbness or tingling in your arms or legs.  Changes in your speech or vision. Summary  An analgesic rebound headache, sometimes called a medication overuse headache or a drug-induced headache, is caused by the overuse of pain medicine (analgesic) to treat the original (primary) headache.  Any type of primary headache can return as a rebound headache if a person regularly takes analgesics. The types of primary headaches that are commonly associated with rebound headaches include migraines, tension headaches, and cluster headaches.  Analgesic rebound headaches can occur with frequent use of over-the-counter medicines and prescription medicines.  Treatment involves stopping the medicine that is being overused. This will improve headache frequency and severity. This information is not intended to replace advice given to you by your health care provider. Make sure you discuss any questions you have with your health care provider. Document Revised: 03/18/2019 Document Reviewed: 03/18/2019 Elsevier Patient Education  2021 [employer] Migraine Headache A migraine headache is an intense, throbbing pain on one side or both sides of the head. Migraine headaches may also cause other symptoms, such as nausea, vomiting, and sensitivity to light and noise. A migraine headache can last from 4 hours to 3 days. Talk with your doctor about what things may bring on  (trigger) your migraine headaches. What are the causes? The exact cause of this condition is not known. However, a migraine may be caused when nerves in the brain become irritated and release chemicals that cause inflammation of blood vessels. This inflammation causes pain. This condition may be triggered or caused by:  Drinking alcohol.  Smoking.  Taking medicines, such as: ? Medicine used to treat chest pain (nitroglycerin). ? Birth control pills. ? Estrogen. ? Certain blood pressure medicines.  Eating or drinking products that contain nitrates, glutamate, aspartame, or tyramine. Aged cheeses, chocolate, or caffeine may also be triggers.  Doing physical activity. Other things that may trigger a migraine headache include:  Menstruation.  Pregnancy.  Hunger.  Stress.  Lack of sleep or too much sleep.  Weather changes.  Fatigue. What increases the risk? The following factors may make you more likely to experience migraine headaches:  Being a certain age. This condition is more common in people who are 58-76 years old.  Being female.  Having a family history of migraine headaches.  Being Caucasian.  Having a mental health condition, such as depression or anxiety.  Being obese. What are the signs or symptoms? The main symptom of this condition is pulsating or throbbing pain. This pain may:  Happen in any area of the head, such as on one side or both sides.  Interfere with daily activities.  Get worse with physical activity.  Get worse with exposure to bright lights  or loud noises. Other symptoms may include:  Nausea.  Vomiting.  Dizziness.  General sensitivity to bright lights, loud noises, or smells. Before you get a migraine headache, you may get warning signs (an aura). An aura may include:  Seeing flashing lights or having blind spots.  Seeing bright spots, halos, or zigzag lines.  Having tunnel vision or blurred vision.  Having numbness or a  tingling feeling.  Having trouble talking.  Having muscle weakness. Some people have symptoms after a migraine headache (postdromal phase), such as:  Feeling tired.  Difficulty concentrating. How is this diagnosed? A migraine headache can be diagnosed based on:  Your symptoms.  A physical exam.  Tests, such as: ? CT scan or an MRI of the head. These imaging tests can help rule out other causes of headaches. ? Taking fluid from the spine (lumbar puncture) and analyzing it (cerebrospinal fluid analysis, or CSF analysis). How is this treated? This condition may be treated with medicines that:  Relieve pain.  Relieve nausea.  Prevent migraine headaches. Treatment for this condition may also include:  Acupuncture.  Lifestyle changes like avoiding foods that trigger migraine headaches.  Biofeedback.  Cognitive behavioral therapy. Follow these instructions at home: Medicines  Take over-the-counter and prescription medicines only as told by your health care provider.  Ask your health care provider if the medicine prescribed to you: ? Requires you to avoid driving or using heavy machinery. ? Can cause constipation. You may need to take these actions to prevent or treat constipation:  Drink enough fluid to keep your urine pale yellow.  Take over-the-counter or prescription medicines.  Eat foods that are high in fiber, such as beans, whole grains, and fresh fruits and vegetables.  Limit foods that are high in fat and processed sugars, such as fried or sweet foods. Lifestyle  Do not drink alcohol.  Do not use any products that contain nicotine or tobacco, such as cigarettes, e-cigarettes, and chewing tobacco. If you need help quitting, ask your health care provider.  Get at least 8 hours of sleep every night.  Find ways to manage stress, such as meditation, deep breathing, or yoga. General instructions  Keep a journal to find out what may trigger your migraine  headaches. For example, write down: ? What you eat and drink. ? How much sleep you get. ? Any change to your diet or medicines.  If you have a migraine headache: ? Avoid things that make your symptoms worse, such as bright lights. ? It may help to lie down in a dark, quiet room. ? Do not drive or use heavy machinery. ? Ask your health care provider what activities are safe for you while you are experiencing symptoms.  Keep all follow-up visits as told by your health care provider. This is important.      Contact a health care provider if:  You develop symptoms that are different or more severe than your usual migraine headache symptoms.  You have more than 15 headache days in one month. Get help right away if:  Your migraine headache becomes severe.  Your migraine headache lasts longer than 72 hours.  You have a fever.  You have a stiff neck.  You have vision loss.  Your muscles feel weak or like you cannot control them.  You start to lose your balance often.  You have trouble walking.  You faint.  You have a seizure. Summary  A migraine headache is an intense, throbbing pain on one side  or both sides of the head. Migraines may also cause other symptoms, such as nausea, vomiting, and sensitivity to light and noise.  This condition may be treated with medicines and lifestyle changes. You may also need to avoid certain things that trigger a migraine headache.  Keep a journal to find out what may trigger your migraine headaches.  Contact your health care provider if you have more than 15 headache days in a month or you develop symptoms that are different or more severe than your usual migraine headache symptoms. This information is not intended to replace advice given to you by your health care provider. Make sure you discuss any questions you have with your health care provider. Document Revised: 06/12/2018 Document Reviewed: 04/02/2018 Elsevier Patient Education   2021 [employer].

## 2020-04-17 NOTE — Progress Notes (Signed)
History:  Selena Malone is a 30 y.o. [phone] who presents to clinic today for headache follow-up.  She had her baby 1/6 without serious complication.  She noticed her headaches worsen 2 weeks after delivery and she has been struggling with them ever since.  She is not breastfeeding.  She has been trying OTC meds daily including Tylenol, Tylenol PM and Excedrin.  She has no prescription medication to take for acute HA at this time.  She is using zoloft and notes it is very helpful.  Amitriptyline was used previously for migraine prevention.      Number of days in the last 4 weeks with:  Severe headache: 14 Moderate headache: 14 Mild headache: 0  No headache: 0   Past Medical History:  Diagnosis Date  . Anxiety and Selena   . Chronic headaches   . Iron deficiency anemia   . Low blood hemoglobin A2 08/11/2017   Negative alpha and beta thal on horizon testing.     Social History   Socioeconomic History  . Marital status: Married    Spouse name: Not on file  . Number of children: Not on file  . Years of education: Not on file  . Highest education level: Not on file  Occupational History  . Not on file  Tobacco Use  . Smoking status: Former Smoker    Packs/day: 0.40    Years: 10.00    Pack years: 4.00    Quit date: 03/23/2017    Years since quitting: 3.0  . Smokeless tobacco: Never Used  Vaping Use  . Vaping Use: Never used  Substance and Sexual Activity  . Alcohol use: Not Currently  . Drug use: Not Currently  . Sexual activity: Yes    Partners: Male    Birth control/protection: None    Comment: 1st intercourse-13, partners- 7, married-3 yrs,   Other Topics Concern  . Not on file  Social History Narrative   ** Merged History Encounter **       Social Determinants of Health   Financial Resource Strain: Not on file  Food Insecurity: Not on file  Transportation Needs: Not on file  Physical Activity: Not on file  Stress: Not on file  Social Connections:  Not on file  Intimate Partner Violence: Not on file    Family History  Problem Relation Age of Onset  . Selena Malone   . Selena Malone   . Selena Malone   . Selena Malone   . Selena Malone   . Selena Malone   . Selena Malone     Allergies  Allergen Reactions  . Codeine Itching    Current Outpatient Medications on File Prior to Visit  Medication Sig Dispense Refill  . sertraline (ZOLOFT) 50 MG tablet Take 1 tablet (50 mg total) by mouth daily. 60 tablet 1   No current facility-administered medications on file prior to visit.     Review of Systems:  All pertinent positive/negative included in HPI, all other review of systems are negative   Objective:  Physical Exam BP 111/73   Pulse (!) 103   LMP 05/17/2019 (Approximate)  CONSTITUTIONAL: Well-developed, well-nourished female in no acute distress.  EYES: EOM intact ENT: Normocephalic CARDIOVASCULAR: Regular rate RESPIRATORY: Normal rate.  MUSCULOSKELETAL: Normal ROM SKIN: Warm, dry without erythema  NEUROLOGICAL: Alert, oriented, CN II-XII grossly intact, Appropriate balance   PSYCH: Normal behavior, mood  PROCEDURE:  TRIGGER POINT INJECTIONS  Procedure: Mixture of 1%  Lidocaine, marcaine and dexamethazone in a ratio of 2:2:1  injected with 1cc each site in bilateral greater Occipital Nerves, bilateral lesser occipital nerves, bilateral cervical paraspinal muscles, bilateral trapezius.  Total amount injected: 8cc.  Pt tolerated procedure well and noted improvement prior to departure.    Assessment & Plan:  Assessment: 1. Migraine without aura and without status migrainosus, not intractable   2. Medication overuse headache   worsening of migraine, new dx of medication overuse   Plan: Topamax 25mg  titrate weekly up to 75mg  at night for migraine prevention. Injections today to break cycle of MOH and improve migraine.   Discontinue  regular tylenol and excedrin Use Flexeril for muscle tension/to help with sleep.  Sedation precautions discussed, especially in regards to overnight care for newborn. No co-sleeping.  Sumatriptan for migraine.  Use early in headache. May repeat after 2 hours if needed.  Do not exceed 2/day, 2 days/week.   Follow-up in 3 months or sooner PRN  , PA-C 04/17/2020 8:22 AM

## 2020-04-19 ENCOUNTER — Encounter: Payer: Self-pay | Admitting: Family Medicine

## 2020-04-19 ENCOUNTER — Ambulatory Visit (INDEPENDENT_AMBULATORY_CARE_PROVIDER_SITE_OTHER): Payer: Medicaid Other | Admitting: Family Medicine

## 2020-04-19 ENCOUNTER — Other Ambulatory Visit: Payer: Self-pay

## 2020-04-19 DIAGNOSIS — Z30430 Encounter for insertion of intrauterine contraceptive device: Secondary | ICD-10-CM

## 2020-04-19 LAB — POCT URINE PREGNANCY: Preg Test, Ur: NEGATIVE

## 2020-04-19 MED ORDER — LEVONORGESTREL 19.5 MCG/DAY IU IUD: INTRAUTERINE_SYSTEM | Freq: Once | INTRAUTERINE | Status: AC

## 2020-04-19 NOTE — Progress Notes (Signed)
Post Partum Visit Note  Selena Malone is a 30 y.o. [phone] female who presents for a postpartum visit. She is 6 weeks postpartum following a normal spontaneous vaginal delivery.  I have fully reviewed the prenatal and intrapartum course. The delivery was at 38.6 gestational weeks.  Anesthesia: epidural. Postpartum course has been uncomplicated. Baby is doing well. Baby is feeding by bottle Rush Barer and Similac. Bleeding started her cycle this past Sunday.. Bowel function is normal. Bladder function is normal. Patient is sexually active. Contraception method is IUD. Postpartum depression screening: negative.   The pregnancy intention screening data noted above was reviewed. Potential methods of contraception were discussed. The patient elected to proceed with IUD or IUS.    Edinburgh Postnatal Depression Scale - 04/19/20 1313      Edinburgh Postnatal Depression Scale:  In the Past 7 Days   I have been able to laugh and see the funny side of things. 0    I have looked forward with enjoyment to things. 0    I have blamed myself unnecessarily when things went wrong. 0    I have been anxious or worried for no good reason. 1    I have felt scared or panicky for no good reason. 1    Things have been getting on top of me. 1    I have been so unhappy that I have had difficulty sleeping. 0    I have felt sad or miserable. 0    I have been so unhappy that I have been crying. 0    The thought of harming myself has occurred to me. 0    Edinburgh Postnatal Depression Scale Total 3            The following portions of the patient's history were reviewed and updated as appropriate: allergies, current medications, past family history, past medical history, past social history, past surgical history and problem list.  Review of Systems Pertinent items noted in HPI and remainder of comprehensive ROS otherwise negative.    Objective:  BP 107/66   Pulse (!) 106   Wt 208 lb (94.3 kg)    LMP 04/16/2020 (Exact Date)   BMI 36.85 kg/m    General:  alert, cooperative and appears stated age  Lungs: normal effort  Heart:  regular rate and rhythm  Abdomen: soft, non-tender; bowel sounds normal; no masses,  no organomegaly   Vulva:  normal  Vagina: normal vagina  Cervix:  multiparous appearance   Procedure: Patient identified, informed consent performed, signed copy in chart, time out was performed.  Urine pregnancy test negative.  Speculum placed in the vagina.  Cervix visualized.  Cleaned with Betadine x 2.  Grasped anteriourly with a single tooth tenaculum.  Uterus sounded to 10 cm.  Liletta IUD placed per manufacturer's recommendations.  Strings trimmed to 3 cm.        Assessment:    Normal postpartum exam. Pap smear not done at today's visit.   Plan:   Essential components of care per ACOG recommendations:  1.  Mood and well being: Patient with negative depression screening today. Reviewed local resources for support. On zoloft and dose is stable, she would like to continue - F/u in 3 months. - Patient does not use tobacco.   2. Infant care and feeding:  -Patient currently breastmilk feeding? No -Social determinants of health (SDOH) reviewed in EPIC. No concerns  3. Sexuality, contraception and birth spacing - Patient does not want a pregnancy  in the next year.  Desired family size is 3 children.  - Reviewed forms of contraception in tiered fashion. Patient desired IUD today.   - Discussed birth spacing of 18 months - Patient given post procedure instructions and Lilettaa care card with expiration date.  Patient is asked to check IUD strings periodically and follow up in 4-6 weeks for IUD check, if needed.  4. Sleep and fatigue -Encouraged family/partner/community support of 4 hrs of uninterrupted sleep to help with mood and fatigue  5. Physical Recovery  - Discussed patients delivery and complications - Patient had a 1st degree laceration, perineal healing  reviewed. Patient expressed understanding - Patient has urinary incontinence? No - Patient is not safe to resume physical and sexual activity  6.  Health Maintenance - Last pap smear done 03/2019 and was normal with negative HPV.   Reva Bores, MD [facility] [employer], [facility]

## 2020-06-21 ENCOUNTER — Encounter: Payer: 59 | Admitting: Family Medicine

## 2020-07-13 ENCOUNTER — Other Ambulatory Visit: Payer: Self-pay

## 2020-07-13 ENCOUNTER — Encounter: Payer: Self-pay | Admitting: Family Medicine

## 2020-07-13 ENCOUNTER — Telehealth (INDEPENDENT_AMBULATORY_CARE_PROVIDER_SITE_OTHER): Payer: Medicaid Other | Admitting: Family Medicine

## 2020-07-13 DIAGNOSIS — F32A Depression, unspecified: Secondary | ICD-10-CM

## 2020-07-13 DIAGNOSIS — F419 Anxiety disorder, unspecified: Secondary | ICD-10-CM

## 2020-07-13 MED ORDER — SERTRALINE HCL 100 MG PO TABS: 100.0000 mg | ORAL_TABLET | Freq: Every day | ORAL | 3 refills | Status: DC

## 2020-07-13 NOTE — Progress Notes (Signed)
    GYNECOLOGY VIRTUAL VISIT ENCOUNTER NOTE  Provider location: [facility] for [facility]   Patient location: Home  I connected with Selena Malone on 07/13/20 at  1:15 PM EDT by MyChart Video Encounter and verified that I am speaking with the correct person using two identifiers.   I discussed the limitations, risks, security and privacy concerns of performing an evaluation and management service virtually and the availability of in person appointments. I also discussed with the patient that there may be a patient responsible charge related to this service. The patient expressed understanding and agreed to proceed.   History:  Selena Malone is a 30 y.o. [phone] female being evaluated today for f/u anxiety and depression. She reports worsening migraines. She denies any abnormal vaginal discharge, bleeding, pelvic pain or other concerns.       Past Medical History:  Diagnosis Date  . Anxiety and depression   . Chronic headaches   . Iron deficiency anemia   . Low blood hemoglobin A2 08/11/2017   Negative alpha and beta thal on horizon testing.    Past Surgical History:  Procedure Laterality Date  . TONSILLECTOMY     The following portions of the patient's history were reviewed and updated as appropriate: allergies, current medications, past family history, past medical history, past social history, past surgical history and problem list.   Health Maintenance:  Normal pap and negative HRHPV on 03/24/2019.   Review of Systems:  Pertinent items noted in HPI and remainder of comprehensive ROS otherwise negative.  Physical Exam:   General:  Alert, oriented and cooperative. Patient appears to be in no acute distress.  Mental Status: Normal mood and affect. Normal behavior. Normal judgment and thought content.   Respiratory: Normal respiratory effort, no problems with respiration noted  Rest of physical exam deferred due to type of encounter  Labs  and Imaging No results found for this or any previous visit (from the past 336 hour(s)). No results found.     Assessment and Plan:     1. Anxiety and depression Increase her Zoloft to 100 mg will likely help with headaches.       I discussed the assessment and treatment plan with the patient. The patient was provided an opportunity to ask questions and all were answered. The patient agreed with the plan and demonstrated an understanding of the instructions.   The patient was advised to call back or seek an in-person evaluation/go to the ED if the symptoms worsen or if the condition fails to improve as anticipated.  I provided 12 minutes of face-to-face time during this encounter.   Reva Bores, MD [facility] [employer], [facility]

## 2020-07-14 ENCOUNTER — Encounter: Payer: Self-pay | Admitting: Physician Assistant

## 2020-07-14 ENCOUNTER — Other Ambulatory Visit: Payer: Self-pay

## 2020-07-14 ENCOUNTER — Ambulatory Visit (INDEPENDENT_AMBULATORY_CARE_PROVIDER_SITE_OTHER): Payer: Medicaid Other | Admitting: Physician Assistant

## 2020-07-14 VITALS — BP 102/68 | HR 87 | Wt 198.0 lb

## 2020-07-14 DIAGNOSIS — G43009 Migraine without aura, not intractable, without status migrainosus: Secondary | ICD-10-CM

## 2020-07-14 MED ORDER — NURTEC 75 MG PO TBDP: 75.0000 mg | ORAL_TABLET | ORAL | 11 refills | Status: DC

## 2020-07-14 MED ORDER — SUMATRIPTAN SUCCINATE 100 MG PO TABS: 100.0000 mg | ORAL_TABLET | Freq: Once | ORAL | 11 refills | Status: DC | PRN

## 2020-07-14 NOTE — Patient Instructions (Signed)

## 2020-07-14 NOTE — Progress Notes (Signed)
History:  Selena Malone is a 30 y.o. [phone] who presents to clinic today for headache eval.  She uses all the sumatriptan she can get every month despite side effects of extreme skin sensitivity, burning in facial muscles.  It resolves the headache within to one hour with the exception of 2 headaches that required repeat dosing.  She is using topamax and noting facial numbness as well as eye and skin spasms (all intermittent)  AND she is not seeing any improvement in severity or frequency of headaches.  She is using the full 75mg  and has been consistently for 3 months.   She has discontinued soda entirely due to side effect with Topamax.   The trigger point / nerve blocks did not help last visit but had previously worked.    HIT6:71 Number of days in the last 4 weeks with:  Severe headache: 10 Moderate headache: 4 Mild headache: 0  No headache: 14   Past Medical History:  Diagnosis Date  . Anxiety and depression   . Chronic headaches   . Iron deficiency anemia   . Low blood hemoglobin A2 08/11/2017   Negative alpha and beta thal on horizon testing.     Social History   Socioeconomic History  . Marital status: Married    Spouse name: Not on file  . Number of children: Not on file  . Years of education: Not on file  . Highest education level: Not on file  Occupational History  . Not on file  Tobacco Use  . Smoking status: Former Smoker    Packs/day: 0.40    Years: 10.00    Pack years: 4.00    Quit date: 03/23/2017    Years since quitting: 3.3  . Smokeless tobacco: Never Used  Vaping Use  . Vaping Use: Never used  Substance and Sexual Activity  . Alcohol use: Not Currently  . Drug use: Not Currently  . Sexual activity: Yes    Partners: Male    Birth control/protection: None    Comment: 1st intercourse-13, partners- 7, married-3 yrs,   Other Topics Concern  . Not on file  Social History Narrative   ** Merged History Encounter **       Social  Determinants of Health   Financial Resource Strain: Not on file  Food Insecurity: Not on file  Transportation Needs: Not on file  Physical Activity: Not on file  Stress: Not on file  Social Connections: Not on file  Intimate Partner Violence: Not on file    Family History  Problem Relation Age of Onset  . Graves' disease Mother   . Migraines Mother   . Cancer Paternal Aunt   . Thyroid disease Paternal Aunt   . Cancer Maternal Grandmother   . Depression Maternal Grandfather   . Cancer Paternal Grandfather     Allergies  Allergen Reactions  . Codeine Itching    Current Outpatient Medications on File Prior to Visit  Medication Sig Dispense Refill  . sertraline (ZOLOFT) 100 MG tablet Take 1 tablet (100 mg total) by mouth daily. 90 tablet 3  . SUMAtriptan (IMITREX) 100 MG tablet Take 1 tablet (100 mg total) by mouth once as needed for up to 1 dose for migraine. May repeat in 2 hours if headache persists or recurs. 9 tablet 3  . topiramate (TOPAMAX) 25 MG tablet Take 3 tablets (75 mg total) by mouth daily. 270 tablet 1  . cyclobenzaprine (FLEXERIL) 10 MG tablet Take 1 tablet (10 mg total)  by mouth every 8 (eight) hours as needed for muscle spasms. (Patient not taking: Reported on 07/13/2020) 30 tablet 1   No current facility-administered medications on file prior to visit.     Review of Systems:  All pertinent positive/negative included in HPI, all other review of systems are negative   Objective:  Physical Exam BP 102/68   Pulse 87   Wt 198 lb (89.8 kg)   BMI 35.07 kg/m  CONSTITUTIONAL: Well-developed, well-nourished female in no acute distress.  EYES: EOM intact ENT: Normocephalic CARDIOVASCULAR: Regular rate and rhythm with no adventitious sounds.  RESPIRATORY: Normal rate.  MUSCULOSKELETAL: Normal ROM SKIN: Warm, dry without erythema  NEUROLOGICAL: Alert, oriented, CN II-XII grossly intact, Appropriate balance PSYCH: Normal behavior, mood   Assessment & Plan:   Assessment: 1. Migraine without aura and without status migrainosus, not intractable    Not improved  Plan: Will trial Nurtec for preventive therapy as Topamax is not working and is not well tolerated.  Nurtec every other day.  Continue Topamax at this time in case it is providing some benefit.  When better on Nurtec, can taper down and off.   Okay to continue Sumatriptan for breakthrough headaches.  On return, we can consider trying a different triptan due to side effects. Continue No Soda. Follow-up in 3 months or sooner PRN  Bertram Denver, PA-C 07/14/2020 8:25 AM

## 2020-07-18 ENCOUNTER — Encounter: Payer: Self-pay | Admitting: *Deleted

## 2020-07-18 ENCOUNTER — Telehealth: Payer: Self-pay | Admitting: *Deleted

## 2020-07-18 NOTE — Telephone Encounter (Signed)
Faxed over [place] denial for Nurtec to [facility] pharmacy

## 2020-07-21 ENCOUNTER — Other Ambulatory Visit: Payer: Self-pay | Admitting: *Deleted

## 2020-07-21 MED ORDER — EMGALITY 120 MG/ML ~~LOC~~ SOAJ: 120.0000 mg | SUBCUTANEOUS | 10 refills | Status: DC

## 2020-07-24 ENCOUNTER — Encounter: Payer: Self-pay | Admitting: *Deleted

## 2020-08-22 ENCOUNTER — Other Ambulatory Visit: Payer: Self-pay

## 2020-08-22 ENCOUNTER — Encounter (HOSPITAL_COMMUNITY): Payer: Self-pay

## 2020-08-22 ENCOUNTER — Ambulatory Visit (HOSPITAL_COMMUNITY)
Admission: EM | Admit: 2020-08-22 | Discharge: 2020-08-22 | Disposition: A | Discharge status: Home / Self Care | Payer: Medicaid Other | Attending: Emergency Medicine | Admitting: Emergency Medicine

## 2020-08-22 DIAGNOSIS — W57XXXA Bitten or stung by nonvenomous insect and other nonvenomous arthropods, initial encounter: Secondary | ICD-10-CM

## 2020-08-22 DIAGNOSIS — S80862A Insect bite (nonvenomous), left lower leg, initial encounter: Secondary | ICD-10-CM

## 2020-08-22 MED ORDER — DOXYCYCLINE HYCLATE 100 MG PO CAPS: 100.0000 mg | ORAL_CAPSULE | Freq: Two times a day (BID) | ORAL | 0 refills | Status: AC

## 2020-08-22 MED ORDER — MUPIROCIN CALCIUM 2 % EX CREA: 1.0000 "application " | TOPICAL_CREAM | Freq: Two times a day (BID) | CUTANEOUS | 0 refills | Status: AC

## 2020-08-22 NOTE — ED Triage Notes (Signed)
Pt states believes was bitten by something. Felt something bite her this past weekend. Small lesion noted to left thigh with surrounding redness (about 3 inches surrounding). Reports area is hard, itchy and warm to touch.

## 2020-08-22 NOTE — Discharge Instructions (Addendum)
Cool compresses, take antibiotic as directed. Follow up with PCP. Return if new or worsening issues or concerns.

## 2020-08-22 NOTE — ED Provider Notes (Signed)
[facility]    CSN: [id number] Arrival date & time: 08/22/20  1858      History   Chief Complaint Chief Complaint  Patient presents with   Possible insect bite    HPI Selena Malone is a 30 y.o. female.   30 year old female presents urgent care with complaint of left inner thigh redness and itching after insect bite 3 days prior.  Patient is put anti-itch cream on it without relief, no fever no nausea no vomiting  The history is provided by the patient. No language interpreter was used.   Past Medical History:  Diagnosis Date   Anxiety and depression    Chronic headaches    Iron deficiency anemia    Low blood hemoglobin A2 08/11/2017   Negative alpha and beta thal on horizon testing.     Patient Active Problem List   Diagnosis Date Noted   Insect bite of left leg, initial encounter 08/22/2020   Migraine without aura and without status migrainosus, not intractable 04/17/2020   Family history of thyroid disease in mother 06/16/2019   Medication overuse headache 05/19/2019   Anxiety and depression 05/19/2019    Past Surgical History:  Procedure Laterality Date   TONSILLECTOMY      OB History     Gravida  4   Para  3   Term  3   Preterm  0   AB  1   Living  3      SAB  0   IAB  1   Ectopic  0   Multiple  0   Live Births  3            Home Medications    Prior to Admission medications   Medication Sig Start Date End Date Taking? Authorizing Provider  doxycycline (VIBRAMYCIN) 100 MG capsule Take 1 capsule (100 mg total) by mouth 2 (two) times daily for 10 days. 08/22/20 09/01/20 Yes Delena Casebeer, Para March, NP  Galcanezumab-gnlm (EMGALITY) 120 MG/ML SOAJ Inject 120 mg into the skin every 30 (thirty) days. 07/21/20  Yes Teague Edwena Blow, PA-C  mupirocin cream (BACTROBAN) 2 % Apply 1 application topically 2 (two) times daily for 7 days. 08/22/20 08/29/20 Yes Aurore Redinger, Para March, NP  sertraline (ZOLOFT) 100 MG tablet Take 1 tablet  (100 mg total) by mouth daily. 07/13/20  Yes Reva Bores, MD  SUMAtriptan (IMITREX) 100 MG tablet Take 1 tablet (100 mg total) by mouth once as needed for up to 1 dose for migraine. May repeat in 2 hours if headache persists or recurs. 07/14/20  Yes Teague Edwena Blow, PA-C  topiramate (TOPAMAX) 25 MG tablet Take 3 tablets (75 mg total) by mouth daily. 04/17/20 08/22/20 Yes Teague Edwena Blow, PA-C  cyclobenzaprine (FLEXERIL) 10 MG tablet Take 1 tablet (10 mg total) by mouth every 8 (eight) hours as needed for muscle spasms. Patient not taking: Reported on 07/13/2020 04/17/20   Glyn Ade, Scot Jun, PA-C  Rimegepant Sulfate (NURTEC) 75 MG TBDP Take 75 mg by mouth every other day. 07/14/20   Bertram Denver, PA-C    Family History Family History  Problem Relation Age of Onset   Graves' disease Mother    Migraines Mother    Cancer Paternal Aunt    Thyroid disease Paternal Aunt    Cancer Maternal Grandmother    Depression Maternal Grandfather    Cancer Paternal Grandfather     Social History Social History   Tobacco Use   Smoking  status: Former    Packs/day: 0.40    Years: 10.00    Pack years: 4.00    Types: Cigarettes    Quit date: 03/23/2017    Years since quitting: 3.4   Smokeless tobacco: Never  Vaping Use   Vaping Use: Never used  Substance Use Topics   Alcohol use: Not Currently   Drug use: Not Currently     Allergies   Codeine   Review of Systems Review of Systems  Skin:  Positive for color change and wound.  All other systems reviewed and are negative.   Physical Exam Triage Vital Signs ED Triage Vitals  Enc Vitals Group     BP 08/22/20 1926 112/67     Pulse Rate 08/22/20 1926 90     Resp 08/22/20 1926 18     Temp 08/22/20 1926 99 F (37.2 C)     Temp src --      SpO2 08/22/20 1926 99 %     Weight --      Height --      Head Circumference --      Peak Flow --      Pain Score 08/22/20 1923 0     Pain Loc --      Pain Edu? --      Excl.  in GC? --    No data found.  Updated Vital Signs BP 112/67   Pulse 90   Temp 99 F (37.2 C)   Resp 18   LMP 07/29/2020 (Approximate) Comment: pt reports irregular periods since IUD placed  SpO2 99%   Visual Acuity Right Eye Distance:   Left Eye Distance:   Bilateral Distance:    Right Eye Near:   Left Eye Near:    Bilateral Near:     Physical Exam Vitals and nursing note reviewed.  Skin:    General: Skin is warm.     Capillary Refill: Capillary refill takes less than 2 seconds.     Findings: Erythema and wound present.       Neurological:     Mental Status: She is alert.     UC Treatments / Results  Labs (all labs ordered are listed, but only abnormal results are displayed) Labs Reviewed - No data to display  EKG   Radiology No results found.  Procedures Procedures (including critical care time)  Medications Ordered in UC Medications - No data to display  Initial Impression / Assessment and Plan / UC Course  I have reviewed the triage vital signs and the nursing notes.  Pertinent labs & imaging results that were available during my care of the patient were reviewed by me and considered in my medical decision making (see chart for details).     Ddx: Insect bite, cellulitis, localized reaction to insect bite Final Clinical Impressions(s) / UC Diagnoses   Final diagnoses:  Insect bite of left leg, initial encounter     Discharge Instructions      Cool compresses, take antibiotic as directed. Follow up with PCP. Return if new or worsening issues or concerns.     ED Prescriptions     Medication Sig Dispense Auth. Provider   doxycycline (VIBRAMYCIN) 100 MG capsule Take 1 capsule (100 mg total) by mouth 2 (two) times daily for 10 days. 20 capsule Lechelle Wrigley, NP   mupirocin cream (BACTROBAN) 2 % Apply 1 application topically 2 (two) times daily for 7 days. 14 g Imanuel Pruiett, Para March, NP      PDMP  not reviewed this encounter.    Clancy Gourd, NP 08/22/20 2013

## 2020-08-23 ENCOUNTER — Telehealth (INDEPENDENT_AMBULATORY_CARE_PROVIDER_SITE_OTHER): Payer: Medicaid Other | Admitting: Family Medicine

## 2020-08-23 ENCOUNTER — Other Ambulatory Visit: Payer: Self-pay

## 2020-08-23 ENCOUNTER — Encounter: Payer: Self-pay | Admitting: Family Medicine

## 2020-08-23 DIAGNOSIS — G43009 Migraine without aura, not intractable, without status migrainosus: Secondary | ICD-10-CM | POA: Diagnosis not present

## 2020-08-23 DIAGNOSIS — F32A Depression, unspecified: Secondary | ICD-10-CM

## 2020-08-23 DIAGNOSIS — F419 Anxiety disorder, unspecified: Secondary | ICD-10-CM

## 2020-08-23 NOTE — Assessment & Plan Note (Addendum)
Continue Zoloft. Has some sexual side effects. Discussed options.

## 2020-08-23 NOTE — Progress Notes (Signed)
    GYNECOLOGY VIRTUAL VISIT ENCOUNTER NOTE  Provider location: [facility]   Patient location: Home  I connected with [name] on 08/23/20 at  1:45 PM EDT by MyChart Video Encounter and verified that I am speaking with the correct person using two identifiers.   I discussed the limitations, risks, security and privacy concerns of performing an evaluation and management service virtually and the availability of in person appointments. I also discussed with the patient that there may be a patient responsible charge related to this service. The patient expressed understanding and agreed to proceed.   History:  Selena Malone is a 30 y.o. [phone] female being evaluated today for anxiety. She has been on Zoloft and we increased the dose to 100 mg. Her anxiety and her OCD is better. She is somewhat overstimulated from time to time. She denies any abnormal vaginal discharge, bleeding, pelvic pain or other concerns.       Past Medical History:  Diagnosis Date   Anxiety and depression    Chronic headaches    Iron deficiency anemia    Low blood hemoglobin A2 08/11/2017   Negative alpha and beta thal on horizon testing.    Past Surgical History:  Procedure Laterality Date   TONSILLECTOMY     The following portions of the patient's history were reviewed and updated as appropriate: allergies, current medications, past family history, past medical history, past social history, past surgical history and problem list.   Health Maintenance:  Normal pap and negative HRHPV on 03/24/2019.    Review of Systems:  Pertinent items noted in HPI and remainder of comprehensive ROS otherwise negative.  Physical Exam:   General:  Alert, oriented and cooperative. Patient appears to be in no acute distress.  Mental Status: Normal mood and affect. Normal behavior. Normal judgment and thought content.   Respiratory: Normal respiratory effort, no problems  with respiration noted  Rest of physical exam deferred due to type of encounter  Labs and Imaging No results found for this or any previous visit (from the past 336 hour(s)). No results found.     Assessment and Plan:     Problem List Items Addressed This Visit       Unprioritized   Anxiety and depression - Primary    Continue Zoloft. Has some sexual side effects. Discussed options.       Migraine without aura and without status migrainosus, not intractable    F/u with Clydie Braun prn             I discussed the assessment and treatment plan with the patient. The patient was provided an opportunity to ask questions and all were answered. The patient agreed with the plan and demonstrated an understanding of the instructions.   The patient was advised to call back or seek an in-person evaluation/go to the ED if the symptoms worsen or if the condition fails to improve as anticipated.  I provided 15 minutes of face-to-face time during this encounter.   Reva Bores, MD Center for [employer], [facility]

## 2020-08-23 NOTE — Assessment & Plan Note (Signed)
F/u with Clydie Braun prn

## 2020-09-20 IMAGING — US US OB LIMITED
1 series · 8 of 8 positions shown · non-contrast
Comparison: none

[Series 1: us ob limited · 8 of 8 slices shown]
[im 1/8]
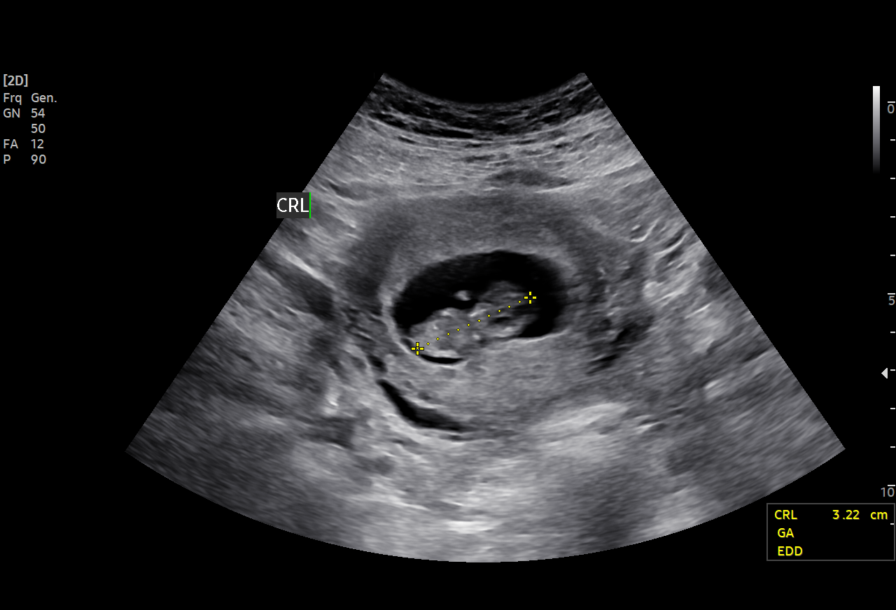
[im 2/8]
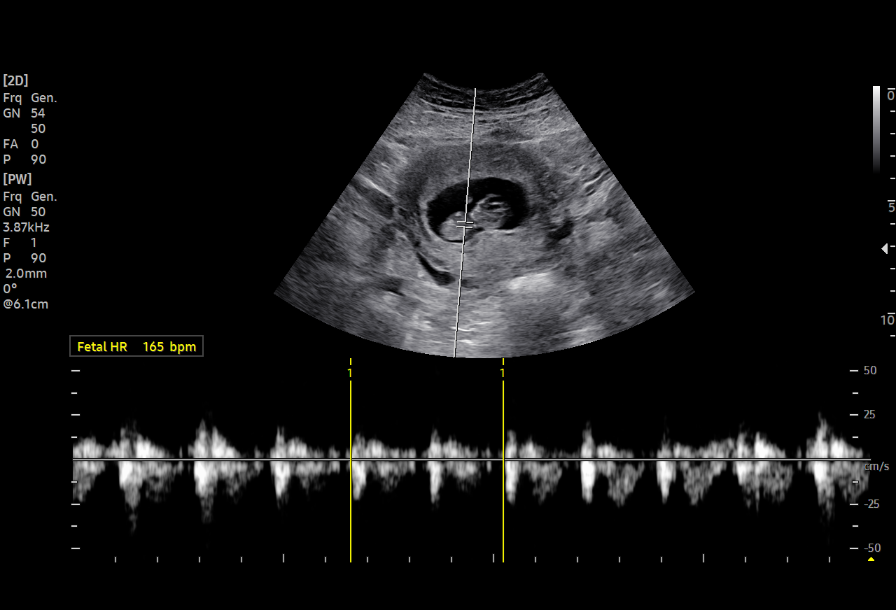
[im 3/8]
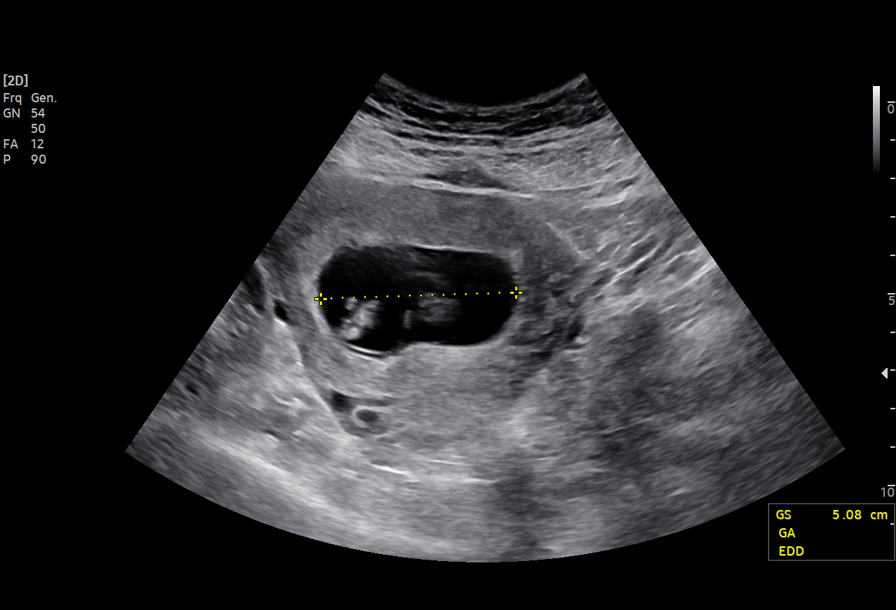
[im 4/8]
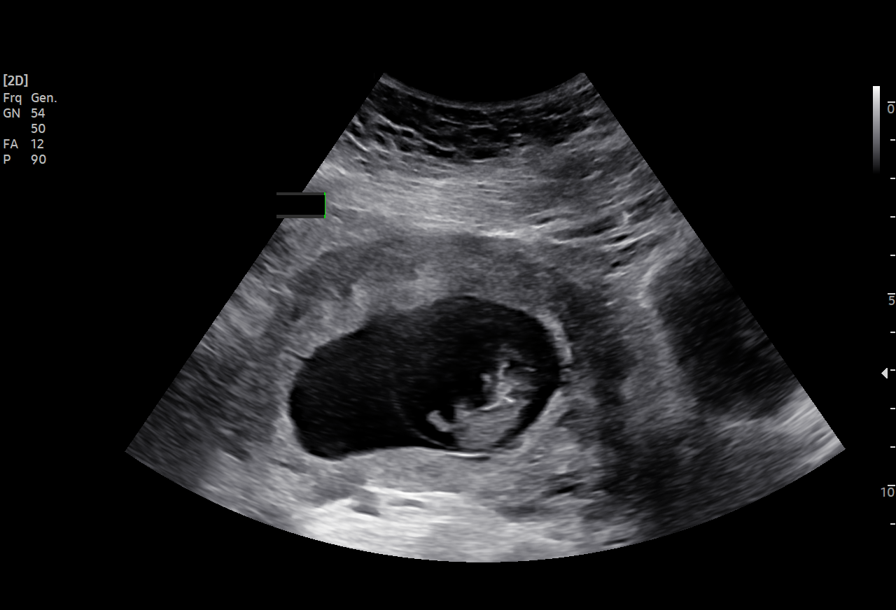
[im 5/8]
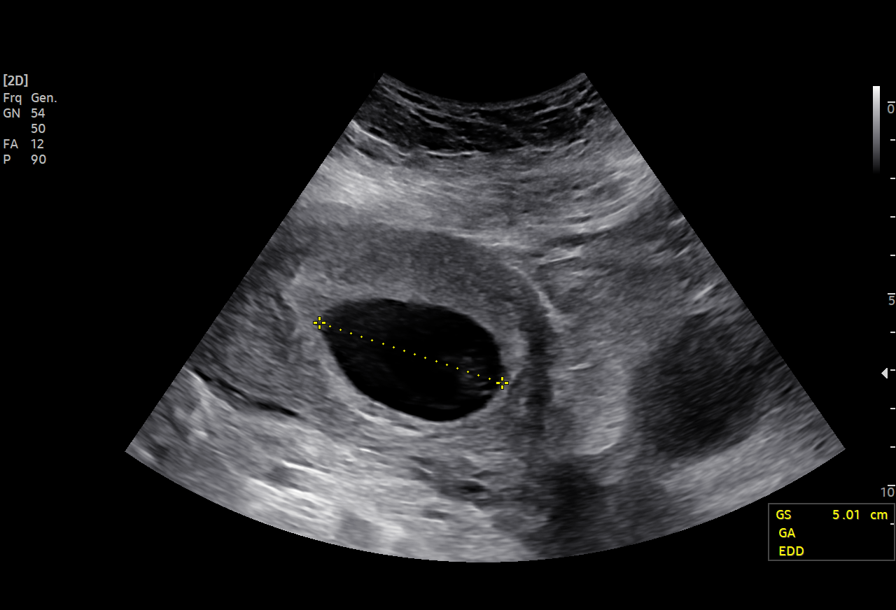
[im 6/8]
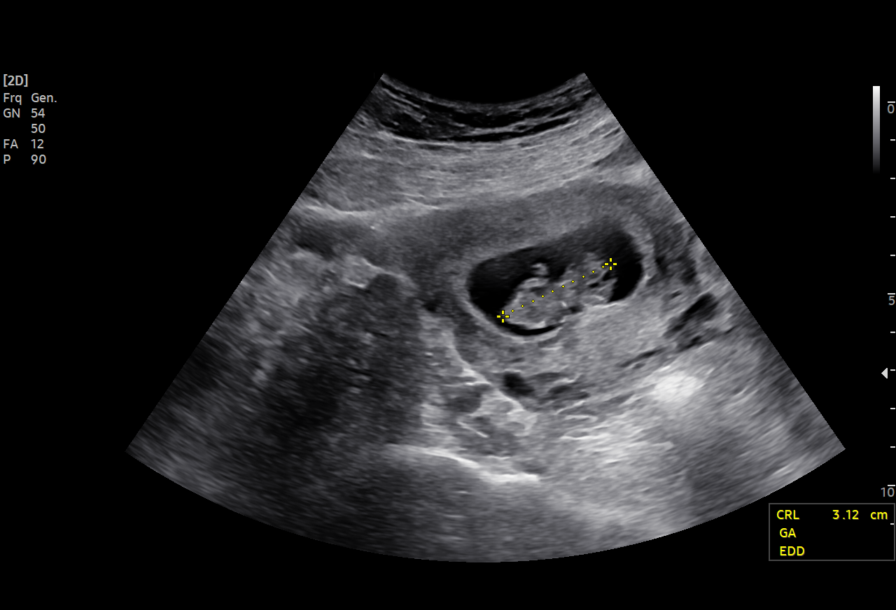
[im 7/8]
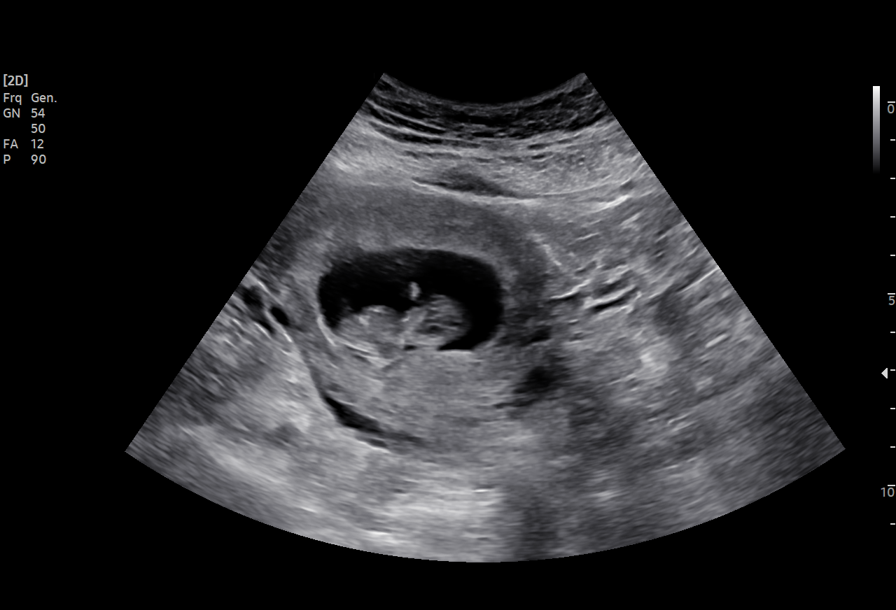
[im 8/8]
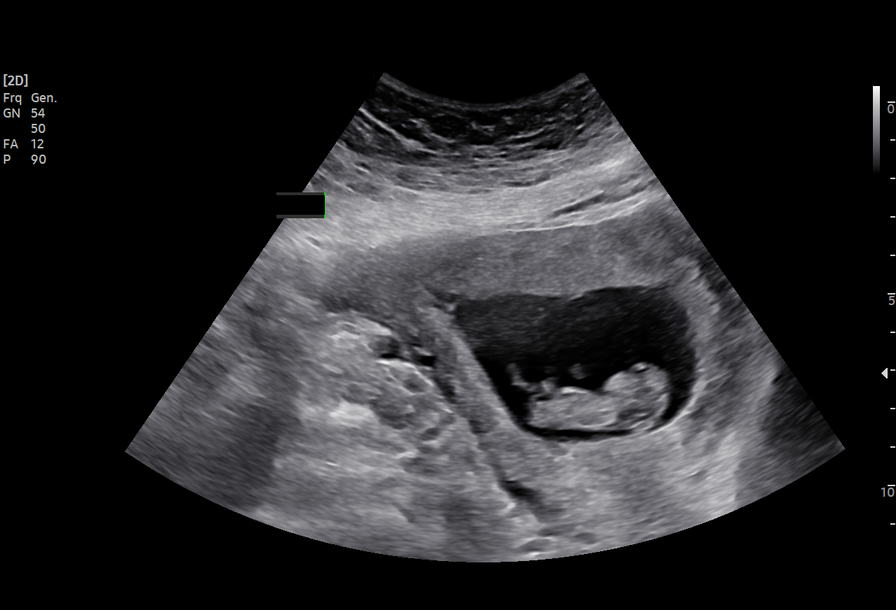

[8 of 8 positions shown; findings below may reference images not displayed]

[REDACTED]
                                                            [REDACTED]care

 1  [HOSPITAL]                         [address].0     [place]

Indications

 9 weeks gestation of pregnancy
 Unsure lmp

Fetal Evaluation

 Num Of Fetuses:         1
 Fetal Heart Rate(bpm):  165
 Cardiac Activity:       Observed
Biometry

 GS:       50.5  mm     G. Age:  11w 5d                  EDD:   03/12/20
 CRL:      31.8  mm     G. Age:  9w 6d                   EDD:   03/25/20
Gestational Age

 Best:          9w 6d      Det. By:  U/S C R L (08/27/19)     EDD:   03/25/20
Comments

 Single Live intrauterine pregnancy. FHR 165.
Impression

 Viable single intrauterine pregnancy
Recommendations

 Prenatal care recommended
              HOLGER MARIO BAHUA

## 2020-09-22 ENCOUNTER — Other Ambulatory Visit: Payer: Self-pay | Admitting: Physician Assistant

## 2020-10-06 ENCOUNTER — Encounter: Payer: Self-pay | Admitting: Physician Assistant

## 2020-10-06 ENCOUNTER — Ambulatory Visit (INDEPENDENT_AMBULATORY_CARE_PROVIDER_SITE_OTHER): Payer: Medicaid Other | Admitting: Physician Assistant

## 2020-10-06 ENCOUNTER — Encounter: Payer: Medicaid Other | Admitting: Physician Assistant

## 2020-10-06 ENCOUNTER — Other Ambulatory Visit: Payer: Self-pay

## 2020-10-06 VITALS — BP 104/66 | HR 86 | Wt 187.0 lb

## 2020-10-06 DIAGNOSIS — G43009 Migraine without aura, not intractable, without status migrainosus: Secondary | ICD-10-CM

## 2020-10-06 MED ORDER — AIMOVIG 140 MG/ML ~~LOC~~ SOAJ: 140.0000 mg | SUBCUTANEOUS | 11 refills | Status: DC

## 2020-10-06 MED ORDER — RIZATRIPTAN BENZOATE 10 MG PO TABS: 10.0000 mg | ORAL_TABLET | ORAL | 11 refills | Status: DC | PRN

## 2020-10-06 NOTE — Progress Notes (Signed)
History:  Selena Malone is a 30 y.o. [phone] who presents to clinic today for headache followup.  She began using emgality - 2 shots in May then one each in June, July and has another ready for August 20th.  No benefit at all.  Topamax - side effects are gone now and she is using 75mg .  It may be helping to prevent small headaches but there are just as many mod-severe ones.   Sumatriptan helpful but she uses all the tablets each month and still has more headaches inadequately treated.   Number of days in the last 4 weeks with:  Severe headache: 3 Moderate headache: 6 Mild headache: 8  No headache: 11   Past Medical History:  Diagnosis Date   Anxiety and depression    Chronic headaches    Iron deficiency anemia    Low blood hemoglobin A2 08/11/2017   Negative alpha and beta thal on horizon testing.     Social History   Socioeconomic History   Marital status: Married    Spouse name: Not on file   Number of children: Not on file   Years of education: Not on file   Highest education level: Not on file  Occupational History   Not on file  Tobacco Use   Smoking status: Former    Packs/day: 0.40    Years: 10.00    Pack years: 4.00    Types: Cigarettes    Quit date: 03/23/2017    Years since quitting: 3.5   Smokeless tobacco: Never  Vaping Use   Vaping Use: Never used  Substance and Sexual Activity   Alcohol use: Not Currently   Drug use: Not Currently   Sexual activity: Yes    Partners: Male    Birth control/protection: None    Comment: 1st intercourse-13, partners- 7, married-3 yrs,   Other Topics Concern   Not on file  Social History Narrative   ** Merged History Encounter **       Social Determinants of Health   Financial Resource Strain: Not on file  Food Insecurity: Not on file  Transportation Needs: Not on file  Physical Activity: Not on file  Stress: Not on file  Social Connections: Not on file  Intimate Partner Violence: Not on file    Family  History  Problem Relation Age of Onset   Graves' disease Mother    Migraines Mother    Cancer Paternal Aunt    Thyroid disease Paternal Aunt    Cancer Maternal Grandmother    Depression Maternal Grandfather    Cancer Paternal Grandfather     Allergies  Allergen Reactions   Codeine Itching    Current Outpatient Medications on File Prior to Visit  Medication Sig Dispense Refill   Galcanezumab-gnlm (EMGALITY) 120 MG/ML SOAJ Inject 120 mg into the skin every 30 (thirty) days. 1.12 mL 10   sertraline (ZOLOFT) 100 MG tablet Take 1 tablet (100 mg total) by mouth daily. 90 tablet 3   SUMAtriptan (IMITREX) 100 MG tablet Take 1 tablet (100 mg total) by mouth once as needed for up to 1 dose for migraine. May repeat in 2 hours if headache persists or recurs. 9 tablet 11   topiramate (TOPAMAX) 25 MG tablet TAKE 3 TABLETS BY MOUTH DAILY 270 tablet 1   cyclobenzaprine (FLEXERIL) 10 MG tablet Take 1 tablet (10 mg total) by mouth every 8 (eight) hours as needed for muscle spasms. (Patient not taking: Reported on 07/13/2020) 30 tablet 1   No  current facility-administered medications on file prior to visit.     Review of Systems:  All pertinent positive/negative included in HPI, all other review of systems are negative   Objective:  Physical Exam BP 104/66   Pulse 86   Wt 187 lb (84.8 kg)   BMI 33.13 kg/m  CONSTITUTIONAL: Well-developed, well-nourished female in no acute distress.  EYES: EOM intact ENT: Normocephalic CARDIOVASCULAR: Regular rate  RESPIRATORY: Normal rate.  MUSCULOSKELETAL: Normal ROM SKIN: Warm, dry without erythema  NEUROLOGICAL: Alert, oriented, CN II-XII grossly intact, Appropriate balance PSYCH: Normal behavior, mood   Assessment & Plan:  Assessment: 1. Migraine without aura and without status migrainosus, not intractable    Inadequate improvement  Plan:  Will trial rizatriptan in place of sumatriptan.  If it is not effective, she can request to switch  back Will trial Aimovig in place of Emgality.  One injection monthly for migraine prevention.  Will increase Topamax to 100mg  daily for prevention of migraine.  Pt has abundance of 25mg  tabs and will use those.    Follow-up in 3 months or sooner PRN  , PA-C 10/06/2020 9:14 AM

## 2020-10-09 ENCOUNTER — Encounter: Payer: Self-pay | Admitting: *Deleted

## 2020-10-09 ENCOUNTER — Encounter: Payer: Self-pay | Admitting: Physician Assistant

## 2020-10-09 NOTE — Patient Instructions (Signed)
Migraine Headache A migraine headache is a very strong throbbing pain on one side or both sides of your head. This type of headache can also cause other symptoms. It can last from 4 hours to 3 days. Talk with your doctor about what things may bring on (trigger) this condition. What are the causes? The exact cause of this condition is not known. This condition may be triggered or caused by: Drinking alcohol. Smoking. Taking medicines, such as: Medicine used to treat chest pain (nitroglycerin). Birth control pills. Estrogen. Some blood pressure medicines. Eating or drinking certain products. Doing physical activity. Other things that may trigger a migraine headache include: Having a menstrual period. Pregnancy. Hunger. Stress. Not getting enough sleep or getting too much sleep. Weather changes. Tiredness (fatigue). What increases the risk? Being 25-55 years old. Being female. Having a family history of migraine headaches. Being Caucasian. Having depression or anxiety. Being very overweight. What are the signs or symptoms? A throbbing pain. This pain may: Happen in any area of the head, such as on one side or both sides. Make it hard to do daily activities. Get worse with physical activity. Get worse around bright lights or loud noises. Other symptoms may include: Feeling sick to your stomach (nauseous). Vomiting. Dizziness. Being sensitive to bright lights, loud noises, or smells. Before you get a migraine headache, you may get warning signs (an aura). An aura may include: Seeing flashing lights or having blind spots. Seeing bright spots, halos, or zigzag lines. Having tunnel vision or blurred vision. Having numbness or a tingling feeling. Having trouble talking. Having weak muscles. Some people have symptoms after a migraine headache (postdromal phase), such as: Tiredness. Trouble thinking (concentrating). How is this treated? Taking medicines that: Relieve  pain. Relieve the feeling of being sick to your stomach. Prevent migraine headaches. Treatment may also include: Having acupuncture. Avoiding foods that bring on migraine headaches. Learning ways to control your body functions (biofeedback). Therapy to help you know and deal with negative thoughts (cognitive behavioral therapy). Follow these instructions at home: Medicines Take over-the-counter and prescription medicines only as told by your doctor. Ask your doctor if the medicine prescribed to you: Requires you to avoid driving or using heavy machinery. Can cause trouble pooping (constipation). You may need to take these steps to prevent or treat trouble pooping: Drink enough fluid to keep your pee (urine) pale yellow. Take over-the-counter or prescription medicines. Eat foods that are high in fiber. These include beans, whole grains, and fresh fruits and vegetables. Limit foods that are high in fat and sugar. These include fried or sweet foods. Lifestyle Do not drink alcohol. Do not use any products that contain nicotine or tobacco, such as cigarettes, e-cigarettes, and chewing tobacco. If you need help quitting, ask your doctor. Get at least 8 hours of sleep every night. Limit and deal with stress. General instructions   Keep a journal to find out what may bring on your migraine headaches. For example, write down: What you eat and drink. How much sleep you get. Any change in what you eat or drink. Any change in your medicines. If you have a migraine headache: Avoid things that make your symptoms worse, such as bright lights. It may help to lie down in a dark, quiet room. Do not drive or use heavy machinery. Ask your doctor what activities are safe for you. Keep all follow-up visits as told by your doctor. This is important. Contact a doctor if: You get a migraine headache that   is different or worse than others you have had. You have more than 15 headache days in one  month. Get help right away if: Your migraine headache gets very bad. Your migraine headache lasts longer than 72 hours. You have a fever. You have a stiff neck. You have trouble seeing. Your muscles feel weak or like you cannot control them. You start to lose your balance a lot. You start to have trouble walking. You pass out (faint). You have a seizure. Summary A migraine headache is a very strong throbbing pain on one side or both sides of your head. These headaches can also cause other symptoms. This condition may be treated with medicines and changes to your lifestyle. Keep a journal to find out what may bring on your migraine headaches. Contact a doctor if you get a migraine headache that is different or worse than others you have had. Contact your doctor if you have more than 15 headache days in a month. This information is not intended to replace advice given to you by your health care provider. Make sure you discuss any questions you have with your health care provider. Document Revised: 06/12/2018 Document Reviewed: 04/02/2018 Elsevier Patient Education  2022 Elsevier Inc.  

## 2020-10-10 ENCOUNTER — Telehealth: Payer: Self-pay | Admitting: Radiology

## 2020-10-10 NOTE — Telephone Encounter (Signed)
Left message for call [facility] to schedule Virtual f/u visit with Dr Shawnie Pons in September

## 2020-11-09 ENCOUNTER — Telehealth (INDEPENDENT_AMBULATORY_CARE_PROVIDER_SITE_OTHER): Payer: Medicaid Other | Admitting: Family Medicine

## 2020-11-09 ENCOUNTER — Other Ambulatory Visit: Payer: Self-pay

## 2020-11-09 DIAGNOSIS — F419 Anxiety disorder, unspecified: Secondary | ICD-10-CM

## 2020-11-09 DIAGNOSIS — F418 Other specified anxiety disorders: Secondary | ICD-10-CM

## 2020-11-09 DIAGNOSIS — F32A Depression, unspecified: Secondary | ICD-10-CM

## 2020-11-09 NOTE — Progress Notes (Signed)
     GYNECOLOGY VIRTUAL VISIT ENCOUNTER NOTE  Provider location: [facility] at [facility]   Patient location: Home  I connected with Selena Malone on 11/09/20 at  1:30 PM EDT by MyChart Video Encounter and verified that I am speaking with the correct person using two identifiers.   I discussed the limitations, risks, security and privacy concerns of performing an evaluation and management service virtually and the availability of in person appointments. I also discussed with the patient that there may be a patient responsible charge related to this service. The patient expressed understanding and agreed to proceed.   History:  Selena Malone is a 30 y.o. [phone] female being evaluated today for depression and anxiety. Reportrs no depression. Her OCD anxiety is doing well. She denies any abnormal vaginal discharge, bleeding, pelvic pain or other concerns.       Past Medical History:  Diagnosis Date   Anxiety and depression    Chronic headaches    Iron deficiency anemia    Low blood hemoglobin A2 08/11/2017   Negative alpha and beta thal on horizon testing.    Past Surgical History:  Procedure Laterality Date   TONSILLECTOMY     The following portions of the patient's history were reviewed and updated as appropriate: allergies, current medications, past family history, past medical history, past social history, past surgical history and problem list.   Health Maintenance:  Normal pap and negative HRHPV on 03/2019.    Review of Systems:  Pertinent items noted in HPI and remainder of comprehensive ROS otherwise negative.  Physical Exam:   General:  Alert, oriented and cooperative. Patient appears to be in no acute distress.  Mental Status: Normal mood and affect. Normal behavior. Normal judgment and thought content.   Respiratory: Normal respiratory effort, no problems with respiration noted  Rest of physical exam deferred due to type of  encounter  Labs and Imaging No results found for this or any previous visit (from the past 336 hour(s)). No results found.     Assessment and Plan:           I discussed the assessment and treatment plan with the patient. The patient was provided an opportunity to ask questions and all were answered. The patient agreed with the plan and demonstrated an understanding of the instructions.   The patient was advised to call back or seek an in-person evaluation/go to the ED if the symptoms worsen or if the condition fails to improve as anticipated.  I provided 11 minutes of face-to-face time during this encounter.  No follow-ups on file.  Reva Bores, MD [facility] for [employer], [facility]

## 2020-11-10 ENCOUNTER — Encounter: Payer: Medicaid Other | Admitting: Physician Assistant

## 2020-11-11 ENCOUNTER — Encounter: Payer: Self-pay | Admitting: Family Medicine

## 2020-11-11 NOTE — Assessment & Plan Note (Signed)
Continue Zoloft, no need for dose increase--has some sexual side effects, but tolerating for now.

## 2020-12-14 ENCOUNTER — Telehealth: Payer: Self-pay | Admitting: Radiology

## 2020-12-14 NOTE — Telephone Encounter (Signed)
Left message for Patient to call [facility] to schedule headache f/u in December with Nada Maclachlan

## 2020-12-25 IMAGING — US US MFM OB FOLLOW-UP
1 series · 14 of 28 positions shown · non-contrast
Comparison: none

[Series 1: us mfm ob follow-up · 35 acquisitions, 14 frames shown]
[im 2/35]
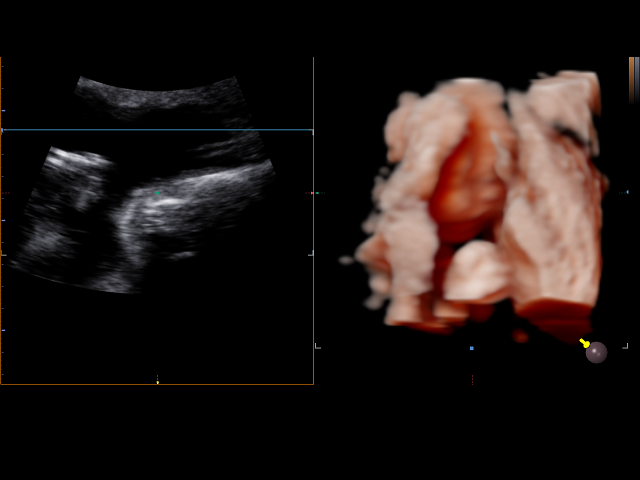
[im 4/35]
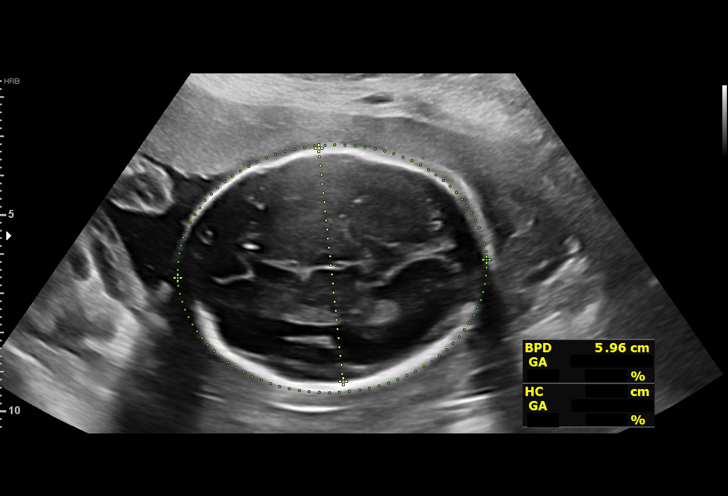
[im 7/35]
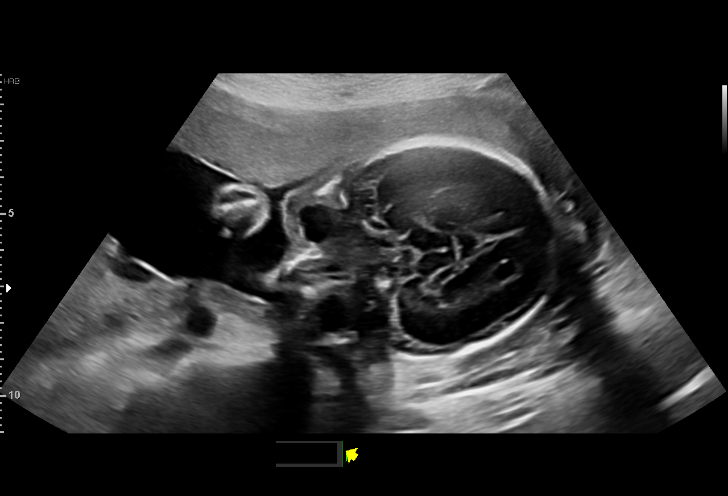
[im 9/35]
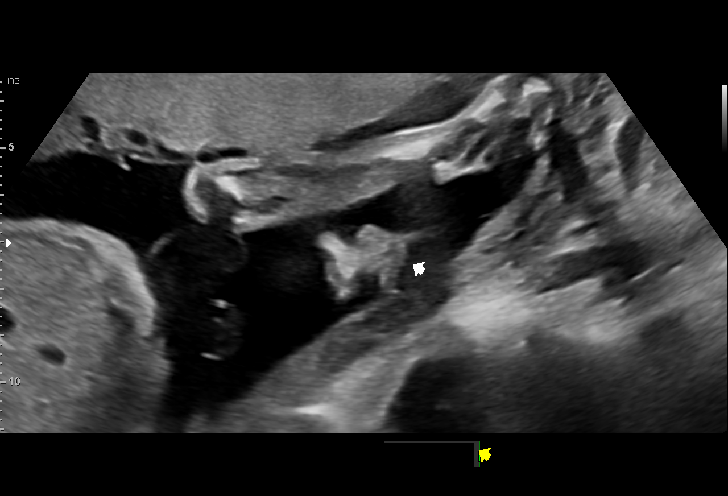
[im 12/35]
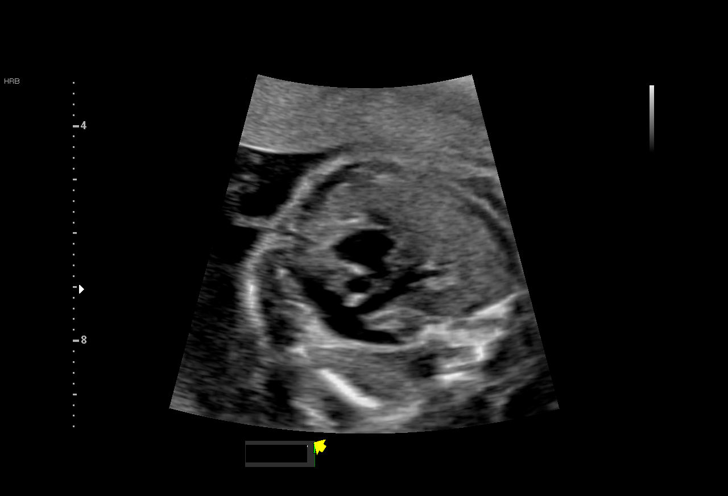
[im 14/35]
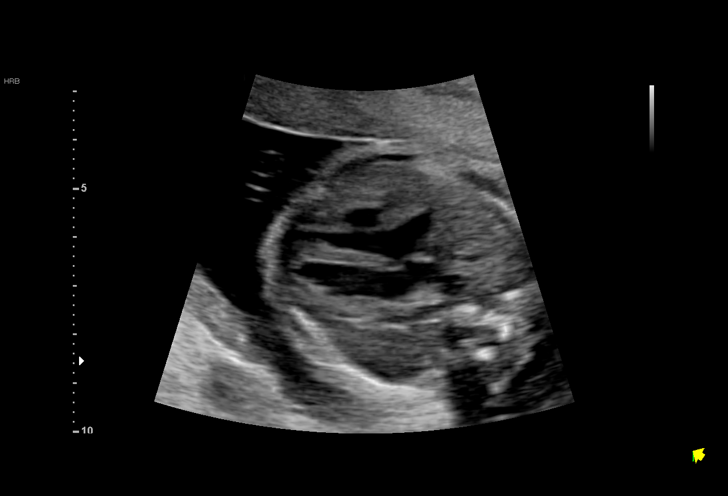
[im 17/35]
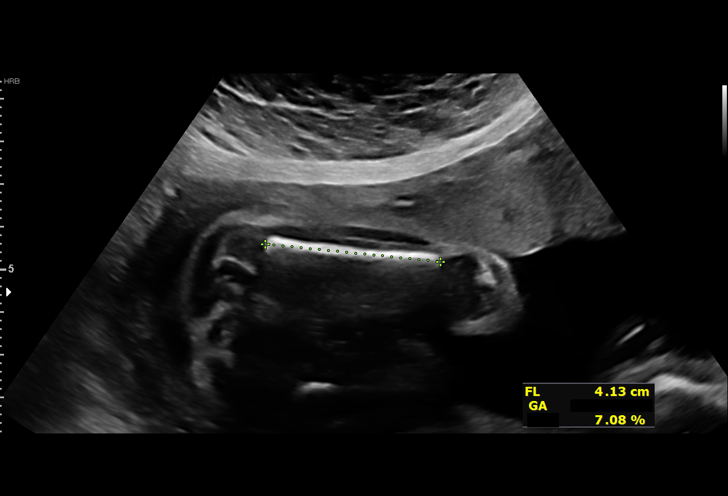
[im 19/35]
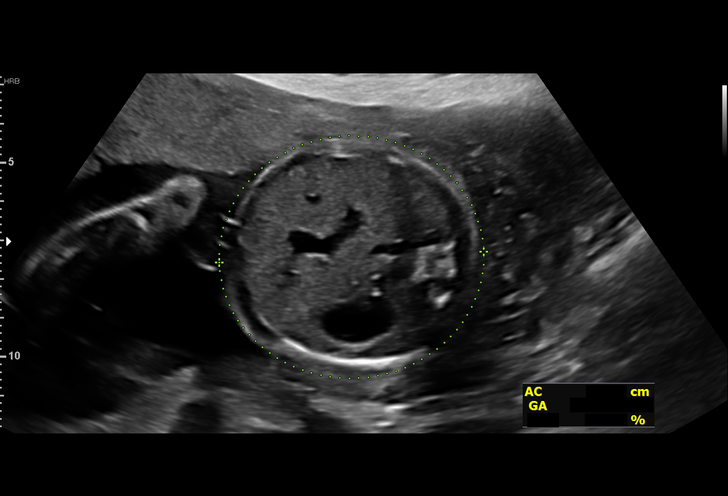
[im 22/35]
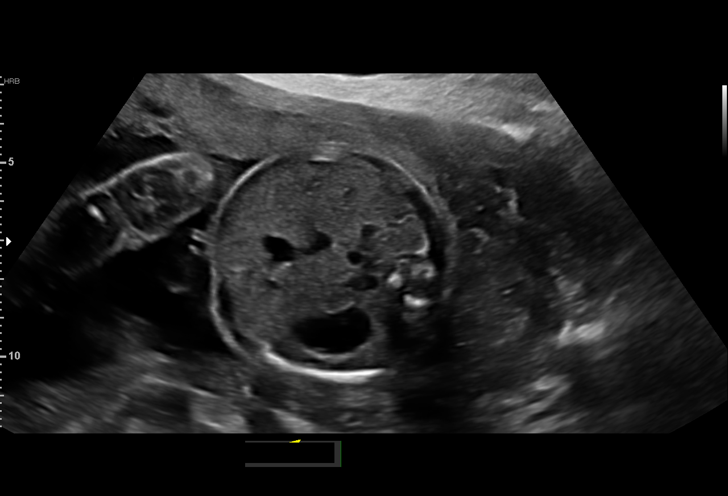
[im 24/35]
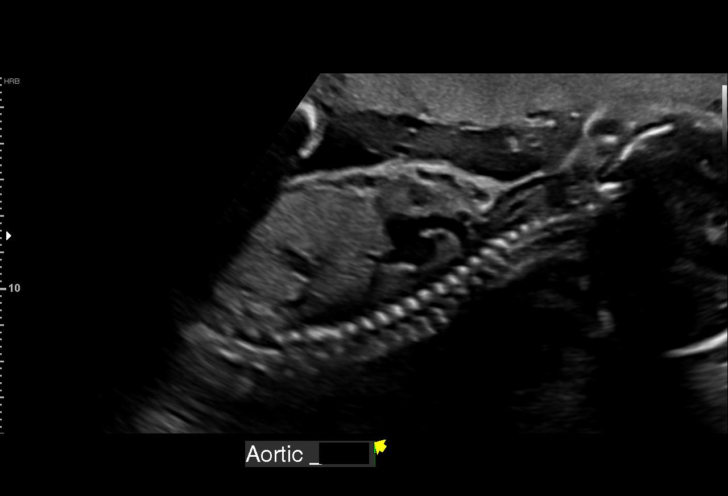
[im 27/35]
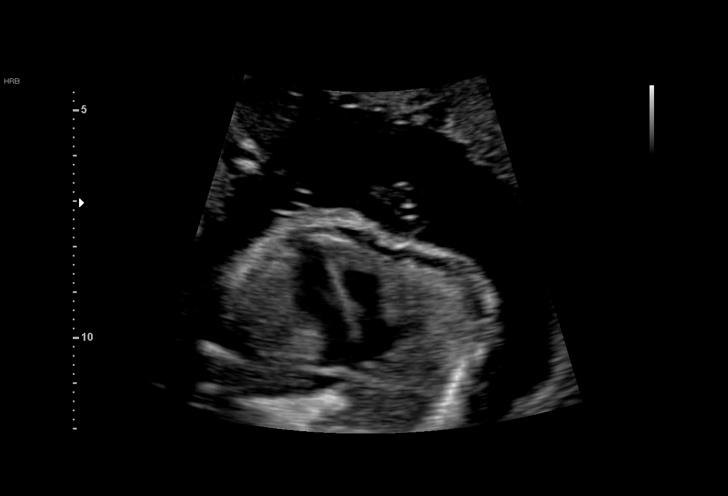
[im 29/35]
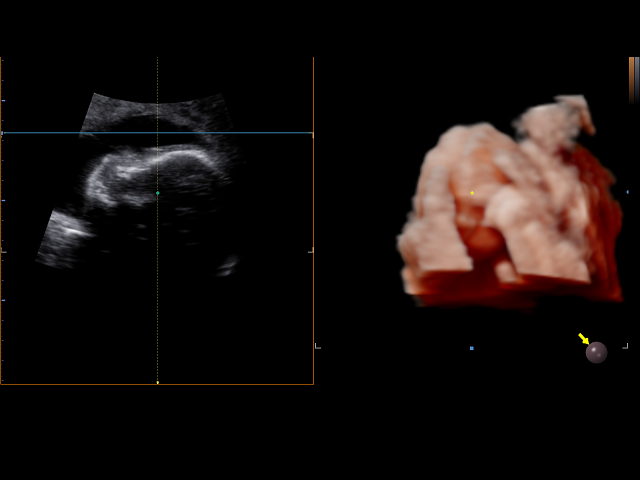
[im 32/35]
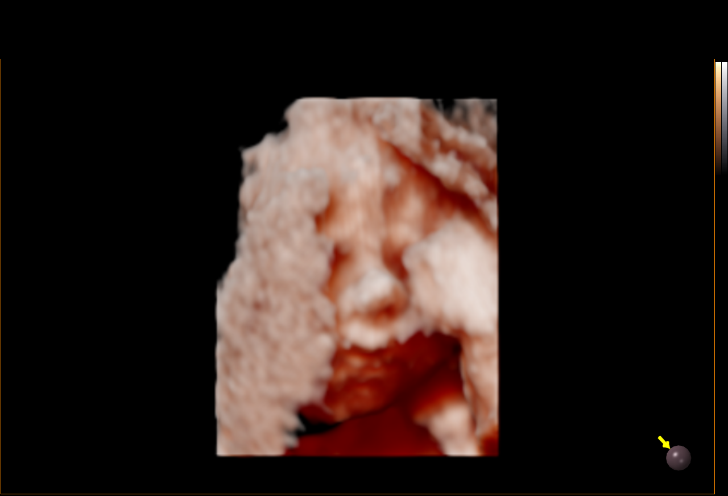
[im 35/35]
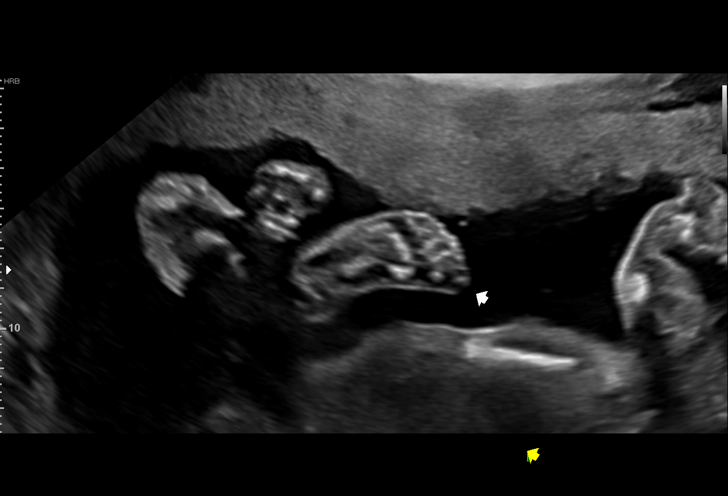

[14 of 28 positions shown; findings below may reference images not displayed]

[REDACTED]

Indications

 Obesity complicating pregnancy, second
 trimester (Pregravid BMI 37)
 24 weeks gestation of pregnancy
 Encounter for uncertain dates
 Encounter for other antenatal screening
 follow-up
 Negative AFP(Low Risk NIPS)(Negative
 Horizon)
Vital Signs

                                                Height:        5'4"
Fetal Evaluation

 Num Of Fetuses:         1
 Fetal Heart Rate(bpm):  144
 Cardiac Activity:       Observed
 Presentation:           Cephalic
 Placenta:               Anterior
 P. Cord Insertion:      Previously Visualized

 Amniotic Fluid
 AFI FV:      Within normal limits

                             Largest Pocket(cm)

Biometry

 BPD:      59.7  mm     G. Age:  24w 3d         29  %    CI:        72.96   %    70 - 86
                                                         FL/HC:      18.8   %    18.7 -
 HC:      222.2  mm     G. Age:  24w 2d         16  %    HC/AC:      1.08        1.04 -
 AC:      206.6  mm     G. Age:  25w 2d         58  %    FL/BPD:     69.8   %    71 - 87
 FL:       41.7  mm     G. Age:  23w 4d          9  %    FL/AC:      20.2   %    20 - 24
 LV:        4.8  mm

 Est. FW:     702  gm      1 lb 9 oz     30  %
OB History

 Gravidity:    4         Term:   2        Prem:   0        SAB:   0
 TOP:          1       Ectopic:  0        Living: 2
Gestational Age

 LMP:           28w 2d        Date:  05/17/19                 EDD:   02/21/20
 U/S Today:     24w 3d                                        EDD:   03/19/20
 Best:          24w 5d     Det. By:  U/S  (11/03/19)          EDD:   03/17/20
Anatomy

 Cranium:               Appears normal         Aortic Arch:            Appears normal
 Cavum:                 Appears normal         Ductal Arch:            Appears normal
 Ventricles:            Appears normal         Diaphragm:              Previously seen
 Choroid Plexus:        Previously seen        Stomach:                Appears normal, left
                                                                       sided
 Cerebellum:            Previously seen        Abdomen:                Appears normal
 Posterior Fossa:       Previously seen        Abdominal Wall:         Previously seen
 Nuchal Fold:           Previously seen        Cord Vessels:           Previously seen
 Face:                  Appears normal         Kidneys:                Appear normal
                        (orbits and profile)
 Lips:                  Appears normal         Bladder:                Appears normal
 Thoracic:              Appears normal         Spine:                  Previously seen
 Heart:                 Appears normal         Upper Extremities:      Previously seen
                        (4CH, axis, and
                        situs)
 RVOT:                  Appears normal         Lower Extremities:      Previously seen
 LVOT:                  Appears normal

 Other:  Fetus appears to be a male. Technically difficult due to maternal
         habitus and fetal position.
Cervix Uterus Adnexa

 Cervix
 Not visualized (advanced GA >55wks)
Impression

 Follow up growth and anatomy in addition to obesity.
 Normal interval growth with measurements consistent with
 dates
 Good fetal movement and amniotic fluid volume
 Fetal anatomy was cleared today.
Recommendations

 Follow up growth as clinically indicated.

## 2021-01-05 ENCOUNTER — Other Ambulatory Visit: Payer: Self-pay | Admitting: Physician Assistant

## 2021-01-05 MED ORDER — TIZANIDINE HCL 4 MG PO TABS: 4.0000 mg | ORAL_TABLET | Freq: Four times a day (QID) | ORAL | 0 refills | Status: DC | PRN

## 2021-01-10 ENCOUNTER — Encounter: Payer: Self-pay | Admitting: *Deleted

## 2021-02-16 ENCOUNTER — Other Ambulatory Visit: Payer: Self-pay

## 2021-02-16 ENCOUNTER — Encounter: Payer: Self-pay | Admitting: Physician Assistant

## 2021-02-16 ENCOUNTER — Ambulatory Visit: Payer: Medicaid Other | Admitting: Physician Assistant

## 2021-02-16 VITALS — BP 110/58 | HR 83 | Wt 179.0 lb

## 2021-02-16 DIAGNOSIS — G43901 Migraine, unspecified, not intractable, with status migrainosus: Secondary | ICD-10-CM | POA: Insufficient documentation

## 2021-02-16 DIAGNOSIS — G43001 Migraine without aura, not intractable, with status migrainosus: Secondary | ICD-10-CM | POA: Diagnosis not present

## 2021-02-16 DIAGNOSIS — G43009 Migraine without aura, not intractable, without status migrainosus: Secondary | ICD-10-CM

## 2021-02-16 MED ORDER — TOPIRAMATE 25 MG PO TABS: 75.0000 mg | ORAL_TABLET | Freq: Every day | ORAL | 1 refills | Status: DC

## 2021-02-16 MED ORDER — TIZANIDINE HCL 4 MG PO TABS: 4.0000 mg | ORAL_TABLET | Freq: Every evening | ORAL | 5 refills | Status: DC | PRN

## 2021-02-16 MED ORDER — NURTEC 75 MG PO TBDP: 75.0000 mg | ORAL_TABLET | ORAL | 11 refills | Status: AC

## 2021-02-16 NOTE — Progress Notes (Signed)
Patient presents for F/U visit on Headaches.  Pt states she has not any relief in headaches.   History:  Selena Malone is a 30 y.o. [phone] who presents to clinic today for followup of headaches.  She notes no improveement with 100mg  Topamax.  She used aimovig since august with no improvement.  No improvement in triptan side effects (excessive skin sensitivity), excessive sensitivity in nose - burning with simple smells)with rizatriptan in place of sumatriptan.  She wants to use nurtec every other day for prevention. She has used sample of nurtec and had good results.  She is using tizanidine to treat tension headaches - but notes it knocks her out.  She requests TPI for unrelenting headache pain.   She states she would like to have a scan of head if today's plan is ineffective.    Past Medical History:  Diagnosis Date   Anxiety and depression    Chronic headaches    Iron deficiency anemia    Low blood hemoglobin A2 08/11/2017   Negative alpha and beta thal on horizon testing.     Social History   Socioeconomic History   Marital status: Married    Spouse name: Not on file   Number of children: Not on file   Years of education: Not on file   Highest education level: Not on file  Occupational History   Not on file  Tobacco Use   Smoking status: Former    Packs/day: 0.40    Years: 10.00    Pack years: 4.00    Types: Cigarettes    Quit date: 03/23/2017    Years since quitting: 3.9   Smokeless tobacco: Never  Vaping Use   Vaping Use: Never used  Substance and Sexual Activity   Alcohol use: Not Currently   Drug use: Not Currently   Sexual activity: Yes    Partners: Male    Birth control/protection: None    Comment: 1st intercourse-13, partners- 7, married-3 yrs,   Other Topics Concern   Not on file  Social History Narrative   ** Merged History Encounter **       Social Determinants of Health   Financial Resource Strain: Not on file  Food Insecurity: Not on file   Transportation Needs: Not on file  Physical Activity: Not on file  Stress: Not on file  Social Connections: Not on file  Intimate Partner Violence: Not on file    Family History  Problem Relation Age of Onset   Graves' disease Mother    Migraines Mother    Cancer Paternal Aunt    Thyroid disease Paternal Aunt    Cancer Maternal Grandmother    Depression Maternal Grandfather    Cancer Paternal Grandfather     Allergies  Allergen Reactions   Codeine Itching    Current Outpatient Medications on File Prior to Visit  Medication Sig Dispense Refill   cyclobenzaprine (FLEXERIL) 10 MG tablet Take 1 tablet (10 mg total) by mouth every 8 (eight) hours as needed for muscle spasms. 30 tablet 1   Erenumab-aooe (AIMOVIG) 140 MG/ML SOAJ Inject 140 mg into the skin every 30 (thirty) days. 1.12 mL 11   Galcanezumab-gnlm (EMGALITY) 120 MG/ML SOAJ Inject 120 mg into the skin every 30 (thirty) days. 1.12 mL 10   sertraline (ZOLOFT) 100 MG tablet Take 1 tablet (100 mg total) by mouth daily. 90 tablet 3   SUMAtriptan (IMITREX) 100 MG tablet Take 1 tablet (100 mg total) by mouth once as needed for up  to 1 dose for migraine. May repeat in 2 hours if headache persists or recurs. 9 tablet 11   tiZANidine (ZANAFLEX) 4 MG tablet Take 1 tablet (4 mg total) by mouth every 6 (six) hours as needed for muscle spasms. 30 tablet 0   topiramate (TOPAMAX) 25 MG tablet TAKE 3 TABLETS BY MOUTH DAILY 270 tablet 1   rizatriptan (MAXALT) 10 MG tablet Take 1 tablet (10 mg total) by mouth as needed for migraine. May repeat in 2 hours if needed 12 tablet 11   No current facility-administered medications on file prior to visit.     Review of Systems:  All pertinent positive/negative included in HPI, all other review of systems are negative   Objective:  Physical Exam BP (!) 110/58    Pulse 83    Wt 179 lb (81.2 kg)    BMI 31.71 kg/m  CONSTITUTIONAL: Well-developed, well-nourished female in no acute distress.   EYES: EOM intact ENT: Normocephalic CARDIOVASCULAR: Regular rate  RESPIRATORY: Normal rate.  MUSCULOSKELETAL: Normal ROM SKIN: Warm, dry without erythema  NEUROLOGICAL: Alert, oriented, CN II-XII grossly intact, Appropriate balance PSYCH: Normal behavior, mood  PROCEDURE:  TRIGGER POINT INJECTIONS/Nerve Blocks  Procedure: Mixture of 1%  Lidocaine, marcaine and dexamethazone in a ratio of 2:2:1  injected with 1cc each site in bilateral greater Occipital Nerves, bilateral lesser occipital nerves, bilateral cervical paraspinal muscles, bilateral trapezius.  Total amount injected: 15cc.  Pt tolerated procedure well.    Assessment & Plan:  Assessment: 1. Migraine without aura and without status migrainosus, not intractable   2. Migraine without aura and with status migrainosus, not intractable      Plan: Injections today to help with current cycle Will begin nurtec every other day for prevention in place of injectable CGRP antagonist. She will continue with topamax for now but may taper down and off.   Continue rizatriptan for acute management despite side effects.  Follow-up in 3 months or sooner PRN   Time spent for this patient encounter: 37 minutes  Bertram Denver, PA-C 02/16/2021 9:01 AM

## 2021-03-17 ENCOUNTER — Emergency Department
Admission: RE | Admit: 2021-03-17 | Discharge: 2021-03-17 | Disposition: A | Discharge status: Home / Self Care | Payer: Medicaid Other | Source: Ambulatory Visit

## 2021-03-17 ENCOUNTER — Other Ambulatory Visit: Payer: Self-pay

## 2021-03-17 VITALS — BP 101/71 | HR 108 | Temp 98.3°F | Resp 18

## 2021-03-17 DIAGNOSIS — J0100 Acute maxillary sinusitis, unspecified: Secondary | ICD-10-CM

## 2021-03-17 DIAGNOSIS — J309 Allergic rhinitis, unspecified: Secondary | ICD-10-CM

## 2021-03-17 MED ORDER — AMOXICILLIN-POT CLAVULANATE 875-125 MG PO TABS
1.0000 | ORAL_TABLET | Freq: Two times a day (BID) | ORAL | 0 refills | Status: DC
Start: 2021-03-17 — End: 2021-04-02

## 2021-03-17 NOTE — ED Provider Notes (Signed)
[facility]    CSN: [id number] Arrival date & time: 03/17/21  1111      History   Chief Complaint Chief Complaint  Patient presents with   Cough   Sore Throat    HPI Selena Malone is a 31 y.o. female.   HPI 31 year old female presents with cough, sore throat and nasal congestion for 2 days.  PMH significant for chronic headaches and iron deficiency anemia.  Past Medical History:  Diagnosis Date   Anxiety and depression    Chronic headaches    Iron deficiency anemia    Low blood hemoglobin A2 08/11/2017   Negative alpha and beta thal on horizon testing.     Patient Active Problem List   Diagnosis Date Noted   Migraine with status migrainosus 02/16/2021   Insect bite of left leg, initial encounter 08/22/2020   Migraine without aura and without status migrainosus, not intractable 04/17/2020   Family history of thyroid disease in mother 06/16/2019   Medication overuse headache 05/19/2019   Anxiety and depression 05/19/2019    Past Surgical History:  Procedure Laterality Date   TONSILLECTOMY      OB History     Gravida  4   Para  3   Term  3   Preterm  0   AB  1   Living  3      SAB  0   IAB  1   Ectopic  0   Multiple  0   Live Births  3            Home Medications    Prior to Admission medications   Medication Sig Start Date End Date Taking? Authorizing Provider  amoxicillin-clavulanate (AUGMENTIN) 875-125 MG tablet Take 1 tablet by mouth every 12 (twelve) hours. 03/17/21  Yes Eliezer Lofts, FNP  Rimegepant Sulfate (NURTEC) 75 MG TBDP Take 75 mg by mouth every other day. 02/16/21   Jaclyn Prime, Collene Leyden, PA-C  rizatriptan (MAXALT) 10 MG tablet Take 1 tablet (10 mg total) by mouth as needed for migraine. May repeat in 2 hours if needed 10/06/20 11/05/20  Jaclyn Prime, Collene Leyden, PA-C  sertraline (ZOLOFT) 100 MG tablet Take 1 tablet (100 mg total) by mouth daily. 07/13/20   Donnamae Jude, MD  tiZANidine (ZANAFLEX) 4 MG  tablet Take 1 tablet (4 mg total) by mouth at bedtime as needed for muscle spasms. 02/16/21   Jaclyn Prime, Collene Leyden, PA-C  topiramate (TOPAMAX) 25 MG tablet Take 3 tablets (75 mg total) by mouth daily. 02/16/21   Paticia Stack, PA-C    Family History Family History  Problem Relation Age of Onset   Graves' disease Mother    Migraines Mother    Cancer Paternal Aunt    Thyroid disease Paternal Aunt    Cancer Maternal Grandmother    Depression Maternal Grandfather    Cancer Paternal Grandfather     Social History Social History   Tobacco Use   Smoking status: Former    Packs/day: 0.40    Years: 10.00    Pack years: 4.00    Types: Cigarettes    Quit date: 03/23/2017    Years since quitting: 3.9   Smokeless tobacco: Never  Vaping Use   Vaping Use: Never used  Substance Use Topics   Alcohol use: Not Currently   Drug use: Not Currently     Allergies   Codeine   Review of Systems Review of Systems  HENT:  Positive for  congestion and sore throat.   Respiratory:  Positive for cough.   All other systems reviewed and are negative.   Physical Exam Triage Vital Signs ED Triage Vitals  Enc Vitals Group     BP      Pulse      Resp      Temp      Temp src      SpO2      Weight      Height      Head Circumference      Peak Flow      Pain Score      Pain Loc      Pain Edu?      Excl. in [place]?    No data found.  Updated Vital Signs BP 101/71 (BP Location: Right Arm)    Pulse (!) 108    Temp 98.3 F (36.8 C) (Oral)    Resp 18    SpO2 97%     Physical Exam Vitals and nursing note reviewed.  Constitutional:      General: She is not in acute distress.    Appearance: She is obese.  HENT:     Head: Normocephalic and atraumatic.     Right Ear: Tympanic membrane and external ear normal.     Left Ear: Tympanic membrane and external ear normal.     Ears:     Comments: Moderate eustachian tube dysfunction noted bilaterally    Mouth/Throat:     Mouth: Mucous  membranes are moist.     Pharynx: Oropharynx is clear.     Comments: Moderate amount of clear drainage of posterior oropharynx noted Eyes:     Extraocular Movements: Extraocular movements intact.     Conjunctiva/sclera: Conjunctivae normal.     Pupils: Pupils are equal, round, and reactive to light.  Cardiovascular:     Rate and Rhythm: Normal rate and regular rhythm.     Pulses: Normal pulses.     Heart sounds: Normal heart sounds.  Pulmonary:     Effort: Pulmonary effort is normal.     Breath sounds: Normal breath sounds.  Musculoskeletal:     Cervical back: Normal range of motion and neck supple.  Skin:    General: Skin is warm and dry.  Neurological:     General: No focal deficit present.     Mental Status: She is alert and oriented to person, place, and time.     UC Treatments / Results  Labs (all labs ordered are listed, but only abnormal results are displayed) Labs Reviewed - No data to display  EKG   Radiology No results found.  Procedures Procedures (including critical care time)  Medications Ordered in UC Medications - No data to display  Initial Impression / Assessment and Plan / UC Course  I have reviewed the triage vital signs and the nursing notes.  Pertinent labs & imaging results that were available during my care of the patient were reviewed by me and considered in my medical decision making (see chart for details).     MDM: 1.  Subacute maxillary sinusitis-Rx'd Augmentin; 2.  Allergic rhinitis-Advised patient to take medication as directed with food to completion.  Advised patient to take OTC Allegra (180 mg fexofenadine without D) with first dose of Augmentin for the next 5 of 7 days for concurrent postnasal drip/drainage.  Encouraged patient to increase daily water intake while taking these medications.  Patient discharged home, hemodynamically stable. Final Clinical Impressions(s) / UC Diagnoses  Final diagnoses:  Subacute maxillary sinusitis   Allergic rhinitis, unspecified seasonality, unspecified trigger     Discharge Instructions      Advised patient to take medication as directed with food to completion.  Advised patient to take OTC Allegra (180 mg fexofenadine without D) with first dose of Augmentin for the next 5 of 7 days for concurrent postnasal drip/drainage.  Encouraged patient to increase daily water intake while taking these medications.     ED Prescriptions     Medication Sig Dispense Auth. Provider   amoxicillin-clavulanate (AUGMENTIN) 875-125 MG tablet Take 1 tablet by mouth every 12 (twelve) hours. 14 tablet Eliezer Lofts, FNP      PDMP not reviewed this encounter.   Eliezer Lofts, [place] 03/17/21 1239

## 2021-03-17 NOTE — ED Triage Notes (Signed)
Pt present coughing with sore throat and congestion. Symptoms started two days ago. Pt tried  otc medication with no relief.

## 2021-03-17 NOTE — Discharge Instructions (Addendum)
Advised patient to take medication as directed with food to completion.  Advised patient to take OTC Allegra (180 mg fexofenadine without D) with first dose of Augmentin for the next 5 of 7 days for concurrent postnasal drip/drainage.  Encouraged patient to increase daily water intake while taking these medications.

## 2021-03-18 ENCOUNTER — Ambulatory Visit: Payer: Medicaid Other

## 2021-03-20 ENCOUNTER — Encounter: Payer: Self-pay | Admitting: *Deleted

## 2021-03-23 ENCOUNTER — Other Ambulatory Visit: Payer: Self-pay | Admitting: Physician Assistant

## 2021-03-23 ENCOUNTER — Other Ambulatory Visit: Payer: Self-pay | Admitting: *Deleted

## 2021-03-23 MED ORDER — DOXEPIN HCL 6 MG PO TABS: 6.0000 mg | ORAL_TABLET | Freq: Every day | ORAL | 3 refills | Status: DC

## 2021-03-23 NOTE — Progress Notes (Signed)
Doxepin 6mg  qhs to help with sleep maintenance.

## 2021-03-23 NOTE — Progress Notes (Signed)
erroneous

## 2021-03-26 ENCOUNTER — Encounter: Payer: Self-pay | Admitting: *Deleted

## 2021-04-02 ENCOUNTER — Other Ambulatory Visit: Payer: Self-pay

## 2021-04-02 ENCOUNTER — Ambulatory Visit (HOSPITAL_COMMUNITY)
Admission: RE | Admit: 2021-04-02 | Discharge: 2021-04-02 | Disposition: A | Discharge status: Home / Self Care | Payer: Medicaid Other | Source: Ambulatory Visit | Attending: Family Medicine | Admitting: Family Medicine

## 2021-04-02 ENCOUNTER — Encounter (HOSPITAL_COMMUNITY): Payer: Self-pay

## 2021-04-02 VITALS — BP 107/63 | HR 112 | Temp 98.7°F | Resp 16

## 2021-04-02 DIAGNOSIS — R197 Diarrhea, unspecified: Secondary | ICD-10-CM | POA: Diagnosis present

## 2021-04-02 LAB — C DIFFICILE QUICK SCREEN W PCR REFLEX
C Diff antigen: NEGATIVE
C Diff interpretation: NOT DETECTED
C Diff toxin: NEGATIVE

## 2021-04-02 MED ORDER — DIPHENOXYLATE-ATROPINE 2.5-0.025 MG PO TABS: 1.0000 | ORAL_TABLET | Freq: Four times a day (QID) | ORAL | 0 refills | Status: DC | PRN

## 2021-04-02 NOTE — ED Triage Notes (Signed)
Had sinus infection ans was given antibiotics 2 weeks ago. Reports for past week had diarrhea. Having lower abd pains. Tried BRAT diet, imodium, pepto bismol and not helping.

## 2021-04-02 NOTE — Discharge Instructions (Signed)
You have had labs (stool samples) sent today. We will call you with any significant abnormalities or if there is need to begin or change treatment or pursue further follow up.  You may also review your test results online through MyChart. If you do not have a MyChart account, instructions to sign up should be on your discharge paperwork.

## 2021-04-03 ENCOUNTER — Encounter: Payer: Self-pay | Admitting: Family Medicine

## 2021-04-03 LAB — GASTROINTESTINAL PANEL BY PCR, STOOL (REPLACES STOOL CULTURE)

## 2021-04-04 ENCOUNTER — Encounter (HOSPITAL_COMMUNITY): Payer: Self-pay | Admitting: Emergency Medicine

## 2021-04-04 ENCOUNTER — Other Ambulatory Visit: Payer: Self-pay

## 2021-04-04 ENCOUNTER — Ambulatory Visit (HOSPITAL_COMMUNITY)
Admission: EM | Admit: 2021-04-04 | Discharge: 2021-04-04 | Disposition: A | Discharge status: Home / Self Care | Payer: Medicaid Other | Attending: Family Medicine | Admitting: Family Medicine

## 2021-04-04 DIAGNOSIS — R197 Diarrhea, unspecified: Secondary | ICD-10-CM

## 2021-04-04 MED ORDER — LEVOFLOXACIN 500 MG PO TABS: 500.0000 mg | ORAL_TABLET | Freq: Every day | ORAL | 0 refills | Status: DC

## 2021-04-04 MED ORDER — PREDNISONE 20 MG PO TABS: 40.0000 mg | ORAL_TABLET | Freq: Every day | ORAL | 0 refills | Status: DC

## 2021-04-04 NOTE — ED Provider Notes (Signed)
[facility]   [id number] 04/02/21 Arrival Time: 1246  ASSESSMENT & PLAN:  1. Diarrhea, unspecified type    Suspect from recent antibiotic use. Imodium not helping.  GI PCR studies sent including C. Diff with negative results. Hopefully diarrhea begins to resolve as expected.  Short trial of: Meds ordered this encounter  Medications   diphenoxylate-atropine (LOMOTIL) 2.5-0.025 MG tablet    Sig: Take 1 tablet by mouth 4 (four) times daily as needed for diarrhea or loose stools.    Dispense:  20 tablet    Refill:  0   No sings of dehydration requiring IVF.  Will proceed to the Emergency Department for evaluation if unable to tolerate PO fluids regularly.  Reviewed expectations re: course of current medical issues. Questions answered. Outlined signs and symptoms indicating need for more acute intervention. Patient verbalized understanding. After Visit Summary given.   SUBJECTIVE: History from: patient. Selena Malone is a 31 y.o. female who presents with non-bloody diarrhea after completing Augmentin approx 5-6 d ago. Many loose stools throughout day. Does have appetite. No n/v. Mild cramping with bowel movements but no abd pain or back pain. Normal urination. No recent travel. Is passing gas.  No LMP recorded. (Menstrual status: IUD).  Past Surgical History:  Procedure Laterality Date   TONSILLECTOMY      OBJECTIVE:  Vitals:   04/02/21 1305  BP: 107/63  Pulse: (!) 112  Resp: 16  Temp: 98.7 F (37.1 C)  TempSrc: Oral  SpO2: 97%    General appearance: alert; no distress Oropharynx: moist Lungs: clear to auscultation bilaterally; unlabored Heart: slight tachycardia noted Abdomen: soft; non-distended Back: no CVA tenderness Extremities: no edema; symmetrical with no gross deformities Skin: warm; dry Neurologic: normal gait Psychological: alert and cooperative; normal mood and affect  Labs: Results for orders placed or performed during the  hospital encounter of 04/02/21  C Difficile Quick Screen w PCR reflex   Specimen: STOOL  Result Value Ref Range   C Diff antigen NEGATIVE NEGATIVE   C Diff toxin NEGATIVE NEGATIVE   C Diff interpretation No C. difficile detected.   Gastrointestinal Panel by PCR , Stool   Specimen: STOOL  Result Value Ref Range   Campylobacter species NOT DETECTED NOT DETECTED   Plesimonas shigelloides NOT DETECTED NOT DETECTED   Salmonella species NOT DETECTED NOT DETECTED   Yersinia enterocolitica NOT DETECTED NOT DETECTED   Vibrio species NOT DETECTED NOT DETECTED   Vibrio cholerae NOT DETECTED NOT DETECTED   Enteroaggregative E coli (EAEC) NOT DETECTED NOT DETECTED   Enteropathogenic E coli (EPEC) NOT DETECTED NOT DETECTED   Enterotoxigenic E coli (ETEC) NOT DETECTED NOT DETECTED   Shiga like toxin producing E coli (STEC) NOT DETECTED NOT DETECTED   Shigella/Enteroinvasive E coli (EIEC) NOT DETECTED NOT DETECTED   Cryptosporidium NOT DETECTED NOT DETECTED   Cyclospora cayetanensis NOT DETECTED NOT DETECTED   Entamoeba histolytica NOT DETECTED NOT DETECTED   Giardia lamblia NOT DETECTED NOT DETECTED   Adenovirus F40/41 NOT DETECTED NOT DETECTED   Astrovirus NOT DETECTED NOT DETECTED   Norovirus GI/GII NOT DETECTED NOT DETECTED   Rotavirus A NOT DETECTED NOT DETECTED   Sapovirus (I, II, IV, and V) NOT DETECTED NOT DETECTED   Labs Reviewed  C DIFFICILE QUICK SCREEN W PCR REFLEX    GASTROINTESTINAL PANEL BY PCR, STOOL (REPLACES STOOL CULTURE)    Imaging: No results found.  Allergies  Allergen Reactions   Codeine Itching  Past Medical History:  Diagnosis Date   Anxiety and depression    Chronic headaches    Iron deficiency anemia    Low blood hemoglobin A2 08/11/2017   Negative alpha and beta thal [facility]one horizon testing.    Social History   Socioeconomic History   Marital status: Married    Spouse name: Not Malone file   Number of children:  Not Malone file   Years of education: Not Malone file   Highest education level: Not Malone file  Occupational History   Not Malone file  Tobacco Use   Smoking status: Former    Packs/day: 0.40    Years: 10.00    Pack years: 4.00    Types: Cigarettes    Quit date: 03/23/2017    Years since quitting: 4.0   Smokeless tobacco: Never  Vaping Use   Vaping Use: Never used  Substance and Sexual Activity   Alcohol use: Not Currently   Drug use: Not Currently   Sexual activity: Yes    Partners: Male    Birth control/protection: None    Comment: 1st intercourse-13, partners- 7, married-3 yrs,   Other Topics Concern   Not Malone file  Social History Narrative   ** Merged History Encounter **       Social Determinants of Health   Financial Resource Strain: Not Malone file  Food Insecurity: Not Malone file  Transportation Needs: Not Malone file  Physical Activity: Not Malone file  Stress: Not Malone file  Social Connections: Not Malone file  Intimate Partner Violence: Not Malone file   Family History  Problem Relation Age of Onset   Graves' disease Mother    Migraines Mother    Cancer Paternal Aunt    Thyroid disease Paternal Aunt    Cancer Maternal Grandmother    Depression Maternal Grandfather    Cancer Paternal Glynda Jaeger, MD 04/04/21 [phone]

## 2021-04-04 NOTE — ED Triage Notes (Signed)
Patient seen 04/02/2021.  Patient reports diarrhea has continued even though she was prescribed medication.  Patient has seen my chart results.  Patient asks for labs to be explained in light of her presentation

## 2021-04-05 NOTE — ED Provider Notes (Signed)
[facility]   [id number] 04/04/21 Arrival Time: 1755  ASSESSMENT & PLAN:  1. Diarrhea, unspecified type    Reviewed results of negative stool studies. Will treat empirically for bacterial/inflammatory cause. Meds ordered this encounter  Medications   levofloxacin (LEVAQUIN) 500 MG tablet    Sig: Take 1 tablet (500 mg total) by mouth daily.    Dispense:  5 tablet    Refill:  0   predniSONE (DELTASONE) 20 MG tablet    Sig: Take 2 tablets (40 mg total) by mouth daily.    Dispense:  10 tablet    Refill:  0    Recommend:  Follow-up Information     Schedule an appointment as soon as possible for a visit  with Gastroenterology, Deboraha Sprang.   Contact information: [address] [place] [address] [phone]                 Formal referral placed to GI.  Otherwise she will try to f/u with her PCP or here if not showing improvement over the next 48-72 hours.  Reviewed expectations re: course of current medical issues. Questions answered. Outlined signs and symptoms indicating need for more acute intervention. Patient verbalized understanding. After Visit Summary given.   SUBJECTIVE: History from: patient.  Selena Malone is a 31 y.o. female who returns with continuing non-bloody diarrhea. Lomotil with very little help. Afebrile. No significant abd/back pain. Still has appetite but afraid to eat much secondary to diarrhea. See last note for further history.  No LMP recorded. (Menstrual status: IUD).  Past Surgical History:  Procedure Laterality Date   TONSILLECTOMY     OBJECTIVE:  Vitals:   04/04/21 1840  BP: 107/61  Pulse: (!) 101  Resp: 20  Temp: 98.4 F (36.9 C)  TempSrc: Oral  SpO2: 100%    General appearance: alert; no distress Abdomen: soft; non-distended Back: no CVA tenderness Extremities: no edema; symmetrical with no gross deformities Skin: warm; dry Neurologic: normal gait Psychological: alert and cooperative;  normal mood and affect  Labs Reviewed: Results for orders placed or performed during the hospital encounter of 04/02/21  C Difficile Quick Screen w PCR reflex   Specimen: STOOL  Result Value Ref Range   C Diff antigen NEGATIVE NEGATIVE   C Diff toxin NEGATIVE NEGATIVE   C Diff interpretation No C. difficile detected.   Gastrointestinal Panel by PCR , Stool   Specimen: STOOL  Result Value Ref Range   Campylobacter species NOT DETECTED NOT DETECTED   Plesimonas shigelloides NOT DETECTED NOT DETECTED   Salmonella species NOT DETECTED NOT DETECTED   Yersinia enterocolitica NOT DETECTED NOT DETECTED   Vibrio species NOT DETECTED NOT DETECTED   Vibrio cholerae NOT DETECTED NOT DETECTED   Enteroaggregative E coli (EAEC) NOT DETECTED NOT DETECTED   Enteropathogenic E coli (EPEC) NOT DETECTED NOT DETECTED   Enterotoxigenic E coli (ETEC) NOT DETECTED NOT DETECTED   Shiga like toxin producing E coli (STEC) NOT DETECTED NOT DETECTED   Shigella/Enteroinvasive E coli (EIEC) NOT DETECTED NOT DETECTED   Cryptosporidium NOT DETECTED NOT DETECTED   Cyclospora cayetanensis NOT DETECTED NOT DETECTED   Entamoeba histolytica NOT DETECTED NOT DETECTED   Giardia lamblia NOT DETECTED NOT DETECTED   Adenovirus F40/41 NOT DETECTED NOT DETECTED   Astrovirus NOT DETECTED NOT DETECTED   Norovirus GI/GII NOT DETECTED NOT DETECTED   Rotavirus A NOT DETECTED NOT DETECTED   Sapovirus (I, II, IV, and V) NOT DETECTED NOT DETECTED  Labs Reviewed - No data to display  Imaging: No results found.  Allergies  Allergen Reactions   Codeine Itching                                               Past Medical History:  Diagnosis Date   Anxiety and depression    Chronic headaches    Iron deficiency anemia    Low blood hemoglobin A2 08/11/2017   Negative alpha and beta thal on horizon testing.    Social History   Socioeconomic History   Marital status: Married    Spouse name: Not on file   Number of  children: Not on file   Years of education: Not on file   Highest education level: Not on file  Occupational History   Not on file  Tobacco Use   Smoking status: Former    Packs/day: 0.40    Years: 10.00    Pack years: 4.00    Types: Cigarettes    Quit date: 03/23/2017    Years since quitting: 4.0   Smokeless tobacco: Never  Vaping Use   Vaping Use: Never used  Substance and Sexual Activity   Alcohol use: Not Currently   Drug use: Not Currently   Sexual activity: Yes    Partners: Male    Birth control/protection: None    Comment: 1st intercourse-13, partners- 7, married-3 yrs,   Other Topics Concern   Not on file  Social History Narrative   ** Merged History Encounter **       Social Determinants of Health   Financial Resource Strain: Not on file  Food Insecurity: Not on file  Transportation Needs: Not on file  Physical Activity: Not on file  Stress: Not on file  Social Connections: Not on file  Intimate Partner Violence: Not on file   Family History  Problem Relation Age of Onset   Graves' disease Mother    Migraines Mother    Cancer Paternal Aunt    Thyroid disease Paternal Aunt    Cancer Maternal Grandmother    Depression Maternal Grandfather    Cancer Paternal Glynda Jaeger, MD 04/05/21 1001

## 2021-04-11 ENCOUNTER — Other Ambulatory Visit: Payer: Self-pay | Admitting: *Deleted

## 2021-04-11 MED ORDER — TOPIRAMATE 100 MG PO TABS: 100.0000 mg | ORAL_TABLET | Freq: Every day | ORAL | 3 refills | Status: DC

## 2021-05-04 ENCOUNTER — Encounter: Payer: Self-pay | Admitting: Physician Assistant

## 2021-05-04 ENCOUNTER — Other Ambulatory Visit: Payer: Self-pay

## 2021-05-04 ENCOUNTER — Ambulatory Visit: Payer: Medicaid Other | Admitting: Physician Assistant

## 2021-05-04 VITALS — BP 95/59 | HR 76 | Wt 169.0 lb

## 2021-05-04 DIAGNOSIS — G43009 Migraine without aura, not intractable, without status migrainosus: Secondary | ICD-10-CM | POA: Diagnosis not present

## 2021-05-04 MED ORDER — TOPIRAMATE 100 MG PO TABS: 100.0000 mg | ORAL_TABLET | Freq: Every day | ORAL | 9 refills | Status: AC

## 2021-05-04 MED ORDER — TIZANIDINE HCL 4 MG PO TABS: 4.0000 mg | ORAL_TABLET | Freq: Every evening | ORAL | 5 refills | Status: AC | PRN

## 2021-05-04 MED ORDER — RIZATRIPTAN BENZOATE 10 MG PO TABS: 10.0000 mg | ORAL_TABLET | ORAL | 9 refills | Status: AC | PRN

## 2021-05-04 NOTE — Progress Notes (Signed)
History:  ?Selena Malone is a 31 y.o. [phone] who presents to clinic today for headache.  She tried doxepin to help with sleep but had chills and her legs felt weird.  It also did not help with her sleep.  She used it one time only.   ?She is using tizandine and tylenol pm regularly in the evening and this is helping her sleep.   ?Nurtec is working very well for headache prevention.  She takes it every other day.   ?Topamax is used daily without issue and helps migraine prevention.   ?Rizatriptan reliably treats headache.   ? ? ?HIT6:54 ?Number of days in the last 4 weeks with:  ?Severe headache: 0 ?Moderate headache: 4 ?Mild headache: 0  ?No headache: 24  ? ?Past Medical History:  ?Diagnosis Date  ? Anxiety and depression   ? Chronic headaches   ? Iron deficiency anemia   ? Low blood hemoglobin A2 08/11/2017  ? Negative alpha and beta thal on [facility] testing.   ? ? ?Social History  ? ?Socioeconomic History  ? Marital status: Married  ?  Spouse name: Not on file  ? Number of children: Not on file  ? Years of education: Not on file  ? Highest education level: Not on file  ?Occupational History  ? Not on file  ?Tobacco Use  ? Smoking status: Former  ?  Packs/day: 0.40  ?  Years: 10.00  ?  Pack years: 4.00  ?  Types: Cigarettes  ?  Quit date: 03/23/2017  ?  Years since quitting: 4.1  ? Smokeless tobacco: Never  ?Vaping Use  ? Vaping Use: Never used  ?Substance and Sexual Activity  ? Alcohol use: Not Currently  ? Drug use: Not Currently  ? Sexual activity: Yes  ?  Partners: Male  ?  Birth control/protection: None  ?  Comment: 1st intercourse-13, partners- 6, married-3 yrs,   ?Other Topics Concern  ? Not on file  ?Social History Narrative  ? ** Merged History Encounter **  ?    ? ?Social Determinants of Health  ? ?Financial Resource Strain: Not on file  ?Food Insecurity: Not on file  ?Transportation Needs: Not on file  ?Physical Activity: Not on file  ?Stress: Not on file  ?Social Connections: Not on file   ?Intimate Partner Violence: Not on file  ? ? ?Family History  ?Problem Relation Age of Onset  ? Graves' disease Mother   ? Migraines Mother   ? Cancer Paternal Aunt   ? Thyroid disease Paternal Aunt   ? Cancer Maternal Grandmother   ? Depression Maternal Grandfather   ? Cancer Paternal Grandfather   ? ? ?Allergies  ?Allergen Reactions  ? Codeine Itching  ? ? ?Current Outpatient Medications on File Prior to Visit  ?Medication Sig Dispense Refill  ? diphenoxylate-atropine (LOMOTIL) 2.5-0.025 MG tablet Take 1 tablet by mouth 4 (four) times daily as needed for diarrhea or loose stools. 20 tablet 0  ? levofloxacin (LEVAQUIN) 500 MG tablet Take 1 tablet (500 mg total) by mouth daily. 5 tablet 0  ? predniSONE (DELTASONE) 20 MG tablet Take 2 tablets (40 mg total) by mouth daily. 10 tablet 0  ? Rimegepant Sulfate (NURTEC) 75 MG TBDP Take 75 mg by mouth every other day. 16 tablet 11  ? sertraline (ZOLOFT) 100 MG tablet Take 1 tablet (100 mg total) by mouth daily. 90 tablet 3  ? topiramate (TOPAMAX) 100 MG tablet Take 1 tablet (100 mg total) by mouth daily.  30 tablet 3  ? Doxepin HCl (SILENOR) 6 MG TABS Take 1 tablet (6 mg total) by mouth at bedtime. (Patient not taking: Reported on 04/04/2021) 30 tablet 3  ? rizatriptan (MAXALT) 10 MG tablet Take 1 tablet (10 mg total) by mouth as needed for migraine. May repeat in 2 hours if needed (Patient not taking: Reported on 04/04/2021) 12 tablet 11  ? tiZANidine (ZANAFLEX) 4 MG tablet Take 1 tablet (4 mg total) by mouth at bedtime as needed for muscle spasms. (Patient not taking: Reported on 04/04/2021) 30 tablet 5  ? ?No current facility-administered medications on file prior to visit.  ? ? ? ?Review of Systems:  ?All pertinent positive/negative included in HPI, all other review of systems are negative ?  ?Objective:  ?Physical Exam ?BP (!) 95/59   Pulse 76   Wt 169 lb (76.7 kg)   BMI 29.94 kg/m?  ?CONSTITUTIONAL: Well-developed, well-nourished female in no acute distress.  ?EYES: EOM  intact ?ENT: Normocephalic ?CARDIOVASCULAR: Regular rate  ?RESPIRATORY: Normal rate.  ?MUSCULOSKELETAL: Normal ROM, ?SKIN: Warm, dry without erythema  ?NEUROLOGICAL: Alert, oriented, CN II-XII grossly intact, Appropriate balance ?PSYCH: Normal behavior, mood ? ? ?Assessment & Plan:  ?Assessment: ?1. Migraine without aura and without status migrainosus, not intractable   ? ? ? ?Plan: ?Continue meds as current.  Pt not interested in reducing topamax at this time but we can consider that in the future if she continues to have such robust response to nurtec.   ?Continue with health habits: limit caffeine/exercise, etc.   ?Follow-up in 65months or sooner PRN ? ?Paticia Stack, PA-C ?05/04/2021  ?10:21 AM ? ?

## 2021-05-07 ENCOUNTER — Encounter: Payer: Self-pay | Admitting: Physician Assistant

## 2021-05-27 ENCOUNTER — Other Ambulatory Visit: Payer: Self-pay | Admitting: Family Medicine

## 2021-05-27 DIAGNOSIS — F419 Anxiety disorder, unspecified: Secondary | ICD-10-CM

## 2021-05-30 ENCOUNTER — Other Ambulatory Visit: Payer: Self-pay | Admitting: *Deleted

## 2021-05-30 ENCOUNTER — Encounter: Payer: Self-pay | Admitting: Family Medicine

## 2021-05-30 DIAGNOSIS — F32A Depression, unspecified: Secondary | ICD-10-CM

## 2021-05-30 MED ORDER — SERTRALINE HCL 100 MG PO TABS: 100.0000 mg | ORAL_TABLET | Freq: Every day | ORAL | 3 refills | Status: AC
